# Patient Record
Sex: Female | Born: 1995 | Race: Black or African American | Hispanic: No | Marital: Single | Smoking: Never smoker
Health system: Southern US, Community
[De-identification: ages and names within clinical notes are randomized; demographics above are authoritative.]

## PROBLEM LIST (undated history)

## (undated) DIAGNOSIS — N760 Acute vaginitis: Secondary | ICD-10-CM

## (undated) DIAGNOSIS — B9689 Other specified bacterial agents as the cause of diseases classified elsewhere: Secondary | ICD-10-CM

---
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
## 2014-05-07 ENCOUNTER — Emergency Department (HOSPITAL_BASED_OUTPATIENT_CLINIC_OR_DEPARTMENT_OTHER)
Admission: EM | Admit: 2014-05-07 | Discharge: 2014-05-08 | Disposition: A | Discharge status: Home / Self Care | Payer: BC Managed Care – PPO | Attending: Emergency Medicine | Admitting: Emergency Medicine

## 2014-05-07 ENCOUNTER — Encounter (HOSPITAL_BASED_OUTPATIENT_CLINIC_OR_DEPARTMENT_OTHER): Payer: Self-pay | Admitting: Emergency Medicine

## 2014-05-07 DIAGNOSIS — Z79899 Other long term (current) drug therapy: Secondary | ICD-10-CM | POA: Diagnosis not present

## 2014-05-07 DIAGNOSIS — A499 Bacterial infection, unspecified: Secondary | ICD-10-CM | POA: Diagnosis not present

## 2014-05-07 DIAGNOSIS — B9689 Other specified bacterial agents as the cause of diseases classified elsewhere: Secondary | ICD-10-CM | POA: Insufficient documentation

## 2014-05-07 DIAGNOSIS — Z3202 Encounter for pregnancy test, result negative: Secondary | ICD-10-CM | POA: Diagnosis not present

## 2014-05-07 DIAGNOSIS — N898 Other specified noninflammatory disorders of vagina: Secondary | ICD-10-CM | POA: Diagnosis present

## 2014-05-07 DIAGNOSIS — N760 Acute vaginitis: Secondary | ICD-10-CM | POA: Diagnosis not present

## 2014-05-07 LAB — URINALYSIS, ROUTINE W REFLEX MICROSCOPIC
BILIRUBIN URINE: NEGATIVE
Glucose, UA: NEGATIVE mg/dL
Hgb urine dipstick: NEGATIVE
Ketones, ur: NEGATIVE mg/dL
NITRITE: NEGATIVE
Protein, ur: NEGATIVE mg/dL
SPECIFIC GRAVITY, URINE: 1.034 — AB (ref 1.005–1.030)
UROBILINOGEN UA: 1 mg/dL (ref 0.0–1.0)
pH: 6.5 (ref 5.0–8.0)

## 2014-05-07 LAB — URINE MICROSCOPIC-ADD ON

## 2014-05-07 LAB — PREGNANCY, URINE: Preg Test, Ur: NEGATIVE

## 2014-05-07 NOTE — ED Notes (Signed)
Pt c/o vaginal discharge and itching that began 2 days ago. Pt has had BV before and sts that this feels the same.

## 2014-05-08 LAB — WET PREP, GENITAL
Trich, Wet Prep: NONE SEEN
Yeast Wet Prep HPF POC: NONE SEEN

## 2014-05-08 MED ORDER — METRONIDAZOLE 500 MG PO TABS: 500.0000 mg | ORAL_TABLET | Freq: Two times a day (BID) | ORAL | Status: DC

## 2014-05-08 NOTE — Discharge Instructions (Signed)
Flagyl as prescribed.  We'll call you if your cultures indicate that you need further treatment.   Bacterial Vaginosis Bacterial vaginosis is a vaginal infection that occurs when the normal balance of bacteria in the vagina is disrupted. It results from an overgrowth of certain bacteria. This is the most common vaginal infection in women of childbearing age. Treatment is important to prevent complications, especially in pregnant women, as it can cause a premature delivery. CAUSES  Bacterial vaginosis is caused by an increase in harmful bacteria that are normally present in smaller amounts in the vagina. Several different kinds of bacteria can cause bacterial vaginosis. However, the reason that the condition develops is not fully understood. RISK FACTORS Certain activities or behaviors can put you at an increased risk of developing bacterial vaginosis, including:  Having a new sex partner or multiple sex partners.  Douching.  Using an intrauterine device (IUD) for contraception. Women do not get bacterial vaginosis from toilet seats, bedding, swimming pools, or contact with objects around them. SIGNS AND SYMPTOMS  Some women with bacterial vaginosis have no signs or symptoms. Common symptoms include:  Grey vaginal discharge.  A fishlike odor with discharge, especially after sexual intercourse.  Itching or burning of the vagina and vulva.  Burning or pain with urination. DIAGNOSIS  Your health care provider will take a medical history and examine the vagina for signs of bacterial vaginosis. A sample of vaginal fluid may be taken. Your health care provider will look at this sample under a microscope to check for bacteria and abnormal cells. A vaginal pH test may also be done.  TREATMENT  Bacterial vaginosis may be treated with antibiotic medicines. These may be given in the form of a pill or a vaginal cream. A second round of antibiotics may be prescribed if the condition comes back after  treatment.  HOME CARE INSTRUCTIONS   Only take over-the-counter or prescription medicines as directed by your health care provider.  If antibiotic medicine was prescribed, take it as directed. Make sure you finish it even if you start to feel better.  Do not have sex until treatment is completed.  Tell all sexual partners that you have a vaginal infection. They should see their health care provider and be treated if they have problems, such as a mild rash or itching.  Practice safe sex by using condoms and only having one sex partner. SEEK MEDICAL CARE IF:   Your symptoms are not improving after 3 days of treatment.  You have increased discharge or pain.  You have a fever. MAKE SURE YOU:   Understand these instructions.  Will watch your condition.  Will get help right away if you are not doing well or get worse. FOR MORE INFORMATION  Centers for Disease Control and Prevention, Division of STD Prevention: SolutionApps.co.zawww.cdc.gov/std American Sexual Health Association (ASHA): www.ashastd.org  Document Released: 09/11/2005 Document Revised: 07/02/2013 Document Reviewed: 04/23/2013 [facility] Patient Information 2015 [place] Cross[place]ExitCare, [place]. This information is not intended to replace advice given to you by your health care provider. Make sure you discuss any questions you have with your health care provider.

## 2014-05-08 NOTE — ED Provider Notes (Signed)
CSN: [id number]     Arrival date & time 05/07/14  2327 History   First MD Initiated Contact with Patient 05/08/14 0034     Chief Complaint  Patient presents with  . Vaginal Discharge     (Consider location/radiation/quality/duration/timing/severity/associated sxs/prior Treatment) Patient is a 18 y.o. female presenting with vaginal discharge. The history is provided by the patient.  Vaginal Discharge Quality:  White Severity:  Moderate Onset quality:  Gradual Duration:  2 days Timing:  Constant Progression:  Worsening Chronicity:  Recurrent Relieved by:  Nothing Worsened by:  Nothing tried Ineffective treatments:  None tried   History reviewed. No pertinent past medical history. History reviewed. No pertinent past surgical history. No family history on file. History  Substance Use Topics  . Smoking status: Never Smoker   . Smokeless tobacco: Not on file  . Alcohol Use: No   OB History   Grav Para Term Preterm Abortions TAB SAB Ect Mult Living                 Review of Systems  Genitourinary: Positive for vaginal discharge.  All other systems reviewed and are negative.     Allergies  Review of patient's allergies indicates no known allergies.  Home Medications   Prior to Admission medications   Medication Sig Start Date End Date Taking? Authorizing Provider  metroNIDAZOLE (FLAGYL) 500 MG tablet Take 1 tablet (500 mg total) by mouth 2 (two) times daily. 07/26/12   Elson AreasLeslie K Sofia, PA-C  Prenatal Vit-Fe Fumarate-FA (PRENATAL COMPLETE) 14-0.4 MG TABS Take 1 tablet by mouth once. 10/30/12   Elson AreasLeslie K Sofia, PA-C   BP 111/75  Pulse 73  Temp(Src) 97.6 F (36.4 C) (Oral)  Resp 18  Ht 5\' 4"  (1.626 m)  Wt 163 lb (73.936 kg)  BMI 27.97 kg/m2  SpO2 100%  LMP 04/08/2014 Physical Exam  Nursing note and vitals reviewed. Constitutional: She is oriented to person, place, and time. She appears well-developed and well-nourished. No distress.  HENT:  Head: Normocephalic and  atraumatic.  Neck: Normal range of motion. Neck supple.  Cardiovascular: Normal rate and regular rhythm.   Pulmonary/Chest: Effort normal. No respiratory distress.  Abdominal: Soft. Bowel sounds are normal. She exhibits no distension. There is no tenderness.  Genitourinary: Uterus normal.  External genitalia appears normal. There is a whitish gray discharge noted. There is no cervical motion tenderness and no adnexal tenderness or masses.  Musculoskeletal: Normal range of motion.  Neurological: She is alert and oriented to person, place, and time.  Skin: Skin is warm and dry. She is not diaphoretic.    ED Course  Procedures (including critical care time) Labs Review Labs Reviewed  URINALYSIS, ROUTINE W REFLEX MICROSCOPIC - Abnormal; Notable for the following:    Specific Gravity, Urine 1.034 (*)    Leukocytes, UA SMALL (*)    All other components within normal limits  URINE MICROSCOPIC-ADD ON - Abnormal; Notable for the following:    Squamous Epithelial / LPF FEW (*)    Bacteria, UA MANY (*)    All other components within normal limits  PREGNANCY, URINE    Imaging Review No results found.   EKG Interpretation None      MDM   Final diagnoses:  None    Patient presents with complaints similar to when she had BV. She has few clue cells on her wet prep and we'll treat with Flagyl. GC and Chlamydia cultures are pending. If these are abnormal she will be called and  informed of the results.    Geoffery Lyons, MD 05/08/14 [phone]

## 2014-05-09 LAB — GC/CHLAMYDIA PROBE AMP
CT Probe RNA: NEGATIVE
GC Probe RNA: NEGATIVE

## 2015-01-11 ENCOUNTER — Encounter (HOSPITAL_BASED_OUTPATIENT_CLINIC_OR_DEPARTMENT_OTHER): Payer: Self-pay | Admitting: *Deleted

## 2015-01-11 ENCOUNTER — Emergency Department (HOSPITAL_BASED_OUTPATIENT_CLINIC_OR_DEPARTMENT_OTHER)
Admission: EM | Admit: 2015-01-11 | Discharge: 2015-01-11 | Disposition: A | Discharge status: Home / Self Care | Payer: BLUE CROSS/BLUE SHIELD | Attending: Emergency Medicine | Admitting: Emergency Medicine

## 2015-01-11 DIAGNOSIS — Z3202 Encounter for pregnancy test, result negative: Secondary | ICD-10-CM | POA: Diagnosis not present

## 2015-01-11 DIAGNOSIS — L03317 Cellulitis of buttock: Secondary | ICD-10-CM | POA: Diagnosis not present

## 2015-01-11 DIAGNOSIS — N898 Other specified noninflammatory disorders of vagina: Secondary | ICD-10-CM | POA: Insufficient documentation

## 2015-01-11 LAB — URINALYSIS, ROUTINE W REFLEX MICROSCOPIC
Bilirubin Urine: NEGATIVE
Glucose, UA: NEGATIVE mg/dL
HGB URINE DIPSTICK: NEGATIVE
Ketones, ur: NEGATIVE mg/dL
LEUKOCYTES UA: NEGATIVE
Nitrite: NEGATIVE
PROTEIN: NEGATIVE mg/dL
Specific Gravity, Urine: 1.029 (ref 1.005–1.030)
UROBILINOGEN UA: 1 mg/dL (ref 0.0–1.0)
pH: 7 (ref 5.0–8.0)

## 2015-01-11 LAB — PREGNANCY, URINE: Preg Test, Ur: NEGATIVE

## 2015-01-11 MED ORDER — SULFAMETHOXAZOLE-TRIMETHOPRIM 800-160 MG PO TABS: 1.0000 | ORAL_TABLET | Freq: Two times a day (BID) | ORAL | Status: AC

## 2015-01-11 MED ORDER — METRONIDAZOLE 500 MG PO TABS: 500.0000 mg | ORAL_TABLET | Freq: Two times a day (BID) | ORAL | Status: DC

## 2015-01-11 NOTE — Discharge Instructions (Signed)
Take the prescribed medication as directed. As we discussed, recommend eating probiotics and drinking lots of water to help prevent recurrences of BV. Follow-up with  Return to the ED for new or worsening symptoms.

## 2015-01-11 NOTE — ED Notes (Signed)
Pt given rx x 2 for septra and flagyl

## 2015-01-11 NOTE — ED Provider Notes (Signed)
CSN: [id number]     Arrival date & time 01/11/15  1824 History   First MD Initiated Contact with Patient 01/11/15 1923     Chief Complaint  Patient presents with  . Vaginal Discharge     (Consider location/radiation/quality/duration/timing/severity/associated sxs/prior Treatment) The history is provided by the patient and medical records.    19 year old female with complaint of vaginal discharge for the past week. She has a history of BV and states similar symptoms. Discharge is white, thick, and foul-smelling. She denies any abdominal pain or pelvic pain. No dysuria, urinary frequency, or hematuria. No new sexual partners or concern for STD. She states she had recent GYN exam and had STD testing at that time. Patient also complains of developing cellulitis of her right buttock. She states she had this a few months ago and did not finish her antibiotics and thinks the infection has returned. She denies any fever or chills.  VSS.  History reviewed. No pertinent past medical history. History reviewed. No pertinent past surgical history. History reviewed. No pertinent family history. History  Substance Use Topics  . Smoking status: Never Smoker   . Smokeless tobacco: Not on file  . Alcohol Use: No   OB History    No data available     Review of Systems  Genitourinary: Positive for vaginal discharge.  Skin: Positive for color change.  All other systems reviewed and are negative.     Allergies  Review of patient's allergies indicates no known allergies.  Home Medications   Prior to Admission medications   Medication Sig Start Date End Date Taking? Authorizing Provider  Prenatal Vit-Fe Fumarate-FA (PRENATAL COMPLETE) 14-0.4 MG TABS Take 1 tablet by mouth once. 10/30/12   Elson Areas, PA-C   BP 108/62 mmHg  Pulse 78  Temp(Src) 98.4 F (36.9 C) (Oral)  Resp 16  Ht  (1.626 m)  Wt 172 lb (78.019 kg)  BMI 29.51 kg/m2  SpO2 100%   Physical Exam  Constitutional: She is  oriented to person, place, and time. She appears well-developed and well-nourished. No distress.  HENT:  Head: Normocephalic and atraumatic.  Mouth/Throat: Oropharynx is clear and moist.  Eyes: Conjunctivae and EOM are normal. Pupils are equal, round, and reactive to light.  Neck: Normal range of motion. Neck supple.  Cardiovascular: Normal rate, regular rhythm and normal heart sounds.   Pulmonary/Chest: Effort normal and breath sounds normal. No respiratory distress. She has no wheezes.  Abdominal: Soft. Bowel sounds are normal. There is no tenderness. There is no guarding.  Genitourinary:  Erythema and induration to right central buttock without appreciable fluctuance or fluid collection  Musculoskeletal: Normal range of motion. She exhibits no edema.  Neurological: She is alert and oriented to person, place, and time.  Skin: Skin is warm and dry. She is not diaphoretic.  Psychiatric: She has a normal mood and affect.  Nursing note and vitals reviewed.   ED Course  Procedures (including critical care time) Labs Review Labs Reviewed  URINALYSIS, ROUTINE W REFLEX MICROSCOPIC - Abnormal; Notable for the following:    APPearance CLOUDY (*)    All other components within normal limits  PREGNANCY, URINE    Imaging Review No results found.   EKG Interpretation None      MDM   Final diagnoses:  Vaginal discharge  Cellulitis of buttock, right   19 year old female with vaginal discharge and right buttock cellulitis. She has a history of BV and states symptoms are similar. She would prefer  not to have another pelvic exam. She reports she had recent STD testing which was negative and has no concern for STD.  Will treat for BV-- patient voiced understanding that without formal testing, unsure if this is proper diagnosis/treatment and cannot definitively exclude STD as cause of vaginal discharge.  She also appears to have a developing cellulitis of her right buttock. She has history of  the same and did not finish her full course of antibiotics. There is erythema and induration of the right central buttock without appreciable fluid collection or fluctuance.  Patient afebrile and non-toxic in appearance.  Will discharge home with Flagyl for presumed BV and Bactrim for cellulitis. She is to follow-up with her PCP.  Discussed plan with patient, he/she acknowledged understanding and agreed with plan of care.  Return precautions given for new or worsening symptoms.  Garlon HatchetLisa M Neira Bentsen, PA-C 01/11/15 [phone]  Nelva Nayobert Beaton, MD 01/16/15 [phone]

## 2015-01-11 NOTE — ED Notes (Signed)
Pt c/o vaginal discharge x 1 week  And c/o abscess to right buttocks x 1 day

## 2015-03-01 ENCOUNTER — Encounter (HOSPITAL_BASED_OUTPATIENT_CLINIC_OR_DEPARTMENT_OTHER): Payer: Self-pay | Admitting: *Deleted

## 2015-03-01 ENCOUNTER — Emergency Department (HOSPITAL_BASED_OUTPATIENT_CLINIC_OR_DEPARTMENT_OTHER)
Admission: EM | Admit: 2015-03-01 | Discharge: 2015-03-01 | Disposition: A | Discharge status: Home / Self Care | Payer: BLUE CROSS/BLUE SHIELD | Attending: Emergency Medicine | Admitting: Emergency Medicine

## 2015-03-01 DIAGNOSIS — L03114 Cellulitis of left upper limb: Secondary | ICD-10-CM | POA: Diagnosis not present

## 2015-03-01 DIAGNOSIS — Z79899 Other long term (current) drug therapy: Secondary | ICD-10-CM | POA: Diagnosis not present

## 2015-03-01 DIAGNOSIS — R2232 Localized swelling, mass and lump, left upper limb: Secondary | ICD-10-CM | POA: Diagnosis present

## 2015-03-01 MED ORDER — MELOXICAM 7.5 MG PO TABS: 15.0000 mg | ORAL_TABLET | Freq: Every day | ORAL | Status: DC

## 2015-03-01 MED ORDER — CLINDAMYCIN HCL 150 MG PO CAPS
300.0000 mg | ORAL_CAPSULE | Freq: Once | ORAL | Status: AC
  Administered 2015-03-01: 300 mg via ORAL
  Filled 2015-03-01: qty 2

## 2015-03-01 MED ORDER — CLINDAMYCIN HCL 150 MG PO CAPS: 300.0000 mg | ORAL_CAPSULE | Freq: Three times a day (TID) | ORAL | Status: DC

## 2015-03-01 NOTE — Discharge Instructions (Signed)
Please follow up with your primary care physician in 1-2 days. If you do not have one please call the [facility] number listed above. Please take your antibiotic until completion. Please read all discharge instructions and return precautions.  ° °Cellulitis °Cellulitis is an infection of the skin and the tissue beneath it. The infected area is usually red and tender. Cellulitis occurs most often in the arms and lower legs.  °CAUSES  °Cellulitis is caused by bacteria that enter the skin through cracks or cuts in the skin. The most common types of bacteria that cause cellulitis are staphylococci and streptococci. °SIGNS AND SYMPTOMS  °· Redness and warmth. °· Swelling. °· Tenderness or pain. °· Fever. °DIAGNOSIS  °Your health care provider can usually determine what is wrong based on a physical exam. Blood tests may also be done. °TREATMENT  °Treatment usually involves taking an antibiotic medicine. °HOME CARE INSTRUCTIONS  °· Take your antibiotic medicine as directed by your health care provider. Finish the antibiotic even if you start to feel better. °· Keep the infected arm or leg elevated to reduce swelling. °· Apply a warm cloth to the affected area up to 4 times per day to relieve pain. °· Take medicines only as directed by your health care provider. °· Keep all follow-up visits as directed by your health care provider. °SEEK MEDICAL CARE IF:  °· You notice red streaks coming from the infected area. °· Your red area gets larger or turns dark in color. °· Your bone or joint underneath the infected area becomes painful after the skin has healed. °· Your infection returns in the same area or another area. °· You notice a swollen bump in the infected area. °· You develop new symptoms. °· You have a fever. °SEEK IMMEDIATE MEDICAL CARE IF:  °· You feel very sleepy. °· You develop vomiting or diarrhea. °· You have a general ill feeling (malaise) with muscle aches and pains. °MAKE SURE YOU:   °· Understand these instructions. °· Will watch your condition. °· Will get help right away if you are not doing well or get worse. °Document Released: 06/21/2005 Document Revised: 01/26/2014 Document Reviewed: 11/27/2011 °ExitCare® Patient Information ©2015 ExitCare, LLC. This information is not intended to replace advice given to you by your health care provider. Make sure you discuss any questions you have with your health care provider. ° ° ° °

## 2015-03-01 NOTE — ED Provider Notes (Signed)
CSN: [id number]     Arrival date & time 03/01/15  1111 History   First MD Initiated Contact with Patient 03/01/15 1149     Chief Complaint  Patient presents with  . Insect Bite     (Consider location/radiation/quality/duration/timing/severity/associated sxs/prior Treatment) HPI Comments: Patient is a 19 year old female presenting to the emergency department for possible insect bite to left upper arm. She states the area initially started off as a bump but has red, warm and painful to touch over the last few days. Denies any fevers. Denies any drainage from the site. States she had a similar episode at which time she received antibiotics. Did not finish the course of antibiotics.    History reviewed. No pertinent past medical history. History reviewed. No pertinent past surgical history. No family history on file. History  Substance Use Topics  . Smoking status: Never Smoker   . Smokeless tobacco: Not on file  . Alcohol Use: No   OB History    No data available     Review of Systems  Skin: Positive for color change and wound.  All other systems reviewed and are negative.     Allergies  Review of patient's allergies indicates no known allergies.  Home Medications   Prior to Admission medications   Medication Sig Start Date End Date Taking? Authorizing Provider  Probiotic Product (PROBIOTIC DAILY PO) Take by mouth.   Yes Historical Provider, MD  clindamycin (CLEOCIN) 150 MG capsule Take 2 capsules (300 mg total) by mouth 3 (three) times daily. May dispense as 150mg  capsules 03/01/15   Francee PiccoloJennifer Tylena Prisk, PA-C  meloxicam (MOBIC) 7.5 MG tablet Take 2 tablets (15 mg total) by mouth daily. 03/01/15   Philomina Leon, PA-C  metroNIDAZOLE (FLAGYL) 500 MG tablet Take 1 tablet (500 mg total) by mouth 2 (two) times daily. 01/11/15   Garlon HatchetLisa M Sanders, PA-C  Prenatal Vit-Fe Fumarate-FA (PRENATAL COMPLETE) 14-0.4 MG TABS Take 1 tablet by mouth once. 10/30/12   Elson AreasLeslie K Sofia, PA-C   BP  107/58 mmHg  Pulse 90  Temp(Src) 98.6 F (37 C) (Oral)  Resp 18  Ht 5\' 4"  (1.626 m)  Wt 170 lb (77.111 kg)  BMI 29.17 kg/m2  SpO2 97%  LMP 02/26/2015 Physical Exam  Constitutional: She is oriented to person, place, and time. She appears well-developed and well-nourished. No distress.  HENT:  Head: Normocephalic and atraumatic.  Right Ear: External ear normal.  Left Ear: External ear normal.  Nose: Nose normal.  Mouth/Throat: Oropharynx is clear and moist.  Eyes: Conjunctivae are normal.  Neck: Normal range of motion. Neck supple.  No nuchal rigidity.   Cardiovascular: Normal rate, regular rhythm, normal heart sounds and intact distal pulses.   Pulmonary/Chest: Effort normal and breath sounds normal. No respiratory distress.  Abdominal: Soft.  Musculoskeletal: Normal range of motion.  Neurological: She is alert and oriented to person, place, and time.  Skin: Skin is warm and dry. She is not diaphoretic.     Psychiatric: She has a normal mood and affect.  Nursing note and vitals reviewed.   ED Course  Procedures (including critical care time) Medications  clindamycin (CLEOCIN) capsule 300 mg (300 mg Oral Given 03/01/15 1232)    Labs Review Labs Reviewed - No data to display  Imaging Review No results found.   EKG Interpretation None      MDM   Final diagnoses:  Left arm cellulitis    Filed Vitals:   03/01/15 1132  BP: 107/58  Pulse: 90  Temp: 98.6 F (37 C)  Resp: 18   Afebrile, NAD, non-toxic appearing, AAOx4.   Suspect uncomplicated cellulitis based on limited area of involvement, minimal pain, no systemic signs of illness (eg, fever, chills, dehydration, altered mental status, tachypnea, tachycardia, hypotension), no risk factors for serious illness (eg, extremes of age, general debility, immunocompromised status).   PE reveals redness, swelling, mildly tender, warm to touch. Skin intact, No bleeding. No bullae. Non purulent. Non circumferential.   Borders are not elevated or sharply demarcated.  Pt was instructed to return to the ED if area surpasses the boarder or pain intensifies.   Will prescribed clindamycin to cover for MRSA, direct pt to apply warm compresses and to return to ED for I&D if pain should increase or abscess should develop. Patient is stable at time of discharge   Francee Piccolo, PA-C 03/01/15 1246  Toy Cookey, MD 03/01/15 1544

## 2015-03-01 NOTE — ED Notes (Signed)
States she has a spider bite to her left upper arm and needs an antibiotic. Hx of cellulitis.

## 2015-05-18 ENCOUNTER — Emergency Department (HOSPITAL_BASED_OUTPATIENT_CLINIC_OR_DEPARTMENT_OTHER)
Admission: EM | Admit: 2015-05-18 | Discharge: 2015-05-18 | Disposition: A | Discharge status: Home / Self Care | Payer: BLUE CROSS/BLUE SHIELD | Attending: Emergency Medicine | Admitting: Emergency Medicine

## 2015-05-18 ENCOUNTER — Encounter (HOSPITAL_BASED_OUTPATIENT_CLINIC_OR_DEPARTMENT_OTHER): Payer: Self-pay

## 2015-05-18 DIAGNOSIS — Z792 Long term (current) use of antibiotics: Secondary | ICD-10-CM | POA: Diagnosis not present

## 2015-05-18 DIAGNOSIS — Z3202 Encounter for pregnancy test, result negative: Secondary | ICD-10-CM | POA: Diagnosis not present

## 2015-05-18 DIAGNOSIS — Z79899 Other long term (current) drug therapy: Secondary | ICD-10-CM | POA: Insufficient documentation

## 2015-05-18 DIAGNOSIS — N760 Acute vaginitis: Secondary | ICD-10-CM | POA: Insufficient documentation

## 2015-05-18 DIAGNOSIS — B9689 Other specified bacterial agents as the cause of diseases classified elsewhere: Secondary | ICD-10-CM

## 2015-05-18 DIAGNOSIS — N898 Other specified noninflammatory disorders of vagina: Secondary | ICD-10-CM | POA: Diagnosis present

## 2015-05-18 LAB — URINALYSIS, ROUTINE W REFLEX MICROSCOPIC
Bilirubin Urine: NEGATIVE
GLUCOSE, UA: NEGATIVE mg/dL
HGB URINE DIPSTICK: NEGATIVE
Ketones, ur: NEGATIVE mg/dL
Leukocytes, UA: NEGATIVE
Nitrite: NEGATIVE
PROTEIN: NEGATIVE mg/dL
SPECIFIC GRAVITY, URINE: 1.035 — AB (ref 1.005–1.030)
Urobilinogen, UA: 1 mg/dL (ref 0.0–1.0)
pH: 5.5 (ref 5.0–8.0)

## 2015-05-18 LAB — WET PREP, GENITAL
TRICH WET PREP: NONE SEEN
YEAST WET PREP: NONE SEEN

## 2015-05-18 LAB — PREGNANCY, URINE: Preg Test, Ur: NEGATIVE

## 2015-05-18 MED ORDER — METRONIDAZOLE 500 MG PO TABS
500.0000 mg | ORAL_TABLET | Freq: Once | ORAL | Status: AC
  Administered 2015-05-18: 500 mg via ORAL
  Filled 2015-05-18: qty 1

## 2015-05-18 MED ORDER — METRONIDAZOLE 500 MG PO TABS: 500.0000 mg | ORAL_TABLET | Freq: Two times a day (BID) | ORAL | Status: DC

## 2015-05-18 NOTE — ED Provider Notes (Signed)
CSN: [id number]     Arrival date & time 05/18/15  2149 History  This chart was scribed for Gilda Crease, MD by Octavia Heir, ED Scribe. This patient was seen in room MH10/MH10 and the patient's care was started at 10:28 PM.    Chief Complaint  Patient presents with  . Vaginal Discharge     The history is provided by the patient. No language interpreter was used.   HPI Comments: Jennifer Paul is a 19 y.o. female who presents to the Emergency Department complaining of sudden onset, recurrent, gradual worsening vaginal discharge onset 3 days ago. Pt notes having some vaginal discharge with foul odor. She states she takes a probiotic to balance her pH but is unsure if that continues to cause her infections. Pt has a hx of having bacterial vaginosis and states she is having similar symptoms. Pt denies dysuria and difficulty urinating.  History reviewed. No pertinent past medical history. History reviewed. No pertinent past surgical history. History reviewed. No pertinent family history. Social History  Substance Use Topics  . Smoking status: Never Smoker   . Smokeless tobacco: None  . Alcohol Use: No   OB History    No data available     Review of Systems  Genitourinary: Positive for vaginal discharge. Negative for dysuria and difficulty urinating.  All other systems reviewed and are negative.     Allergies  Review of patient's allergies indicates no known allergies.  Home Medications   Prior to Admission medications   Medication Sig Start Date End Date Taking? Authorizing Provider  Probiotic Product (PROBIOTIC DAILY PO) Take by mouth.   Yes Historical Provider, MD  clindamycin (CLEOCIN) 150 MG capsule Take 2 capsules (300 mg total) by mouth 3 (three) times daily. May dispense as  capsules 03/01/15   Francee Piccolo, PA-C  meloxicam (MOBIC) 7.5 MG tablet Take 2 tablets (15 mg total) by mouth daily. 03/01/15   Jennifer Piepenbrink, PA-C  metroNIDAZOLE (FLAGYL)  500 MG tablet Take 1 tablet (500 mg total) by mouth 2 (two) times daily. 01/11/15   Garlon Hatchet, PA-C  Prenatal Vit-Fe Fumarate-FA (PRENATAL COMPLETE) 14-0.4 MG TABS Take 1 tablet by mouth once. 10/30/12   Elson Areas, PA-C   Triage vitals: BP 118/64 mmHg  Pulse 91  Temp(Src) 97.7 F (36.5 C) (Oral)  Resp 16  Ht  (1.626 m)  Wt 178 lb (80.74 kg)  BMI 30.54 kg/m2  SpO2 99% Physical Exam  Constitutional: She is oriented to person, place, and time. She appears well-developed and well-nourished. No distress.  HENT:  Head: Normocephalic and atraumatic.  Right Ear: Hearing normal.  Left Ear: Hearing normal.  Nose: Nose normal.  Mouth/Throat: Oropharynx is clear and moist and mucous membranes are normal.  Eyes: Conjunctivae and EOM are normal. Pupils are equal, round, and reactive to light.  Neck: Normal range of motion. Neck supple.  Cardiovascular: Regular rhythm, S1 normal and S2 normal.  Exam reveals no gallop and no friction rub.   No murmur heard. Pulmonary/Chest: Effort normal and breath sounds normal. No respiratory distress. She exhibits no tenderness.  Abdominal: Soft. Normal appearance and bowel sounds are normal. There is no hepatosplenomegaly. There is no tenderness. There is no rebound, no guarding, no tenderness at McBurney's point and negative Murphy's sign. No hernia.  Genitourinary: Vagina normal and uterus normal. There is no rash on the right labia. There is no rash on the left labia. Cervix exhibits no motion tenderness and no discharge. Right  adnexum displays no mass and no tenderness. Left adnexum displays no mass and no tenderness.  Musculoskeletal: Normal range of motion.  Neurological: She is alert and oriented to person, place, and time. She has normal strength. No cranial nerve deficit or sensory deficit. Coordination normal. GCS eye subscore is 4. GCS verbal subscore is 5. GCS motor subscore is 6.  Skin: Skin is warm, dry and intact. No rash noted. No  cyanosis.  Psychiatric: She has a normal mood and affect. Her speech is normal and behavior is normal. Thought content normal.  Nursing note and vitals reviewed.   ED Course  Procedures  DIAGNOSTIC STUDIES: Oxygen Saturation is 99% on RA, normal by my interpretation.  COORDINATION OF CARE:  10:31 PM Discussed treatment plan which includes pelvic exam with pt at bedside and pt agreed to plan.  Labs Review Labs Reviewed - No data to display  Imaging Review No results found. I have personally reviewed and evaluated these images and lab results as part of my medical decision-making.   EKG Interpretation None      MDM   Final diagnoses:  None   bacterial vaginosis  Patient presents to the ER for evaluation of 2 day history of vaginal discharge with foul odor. She reports that this is similar to this bacterial vaginosis episodes she has had in the past. Pelvic examination revealed small amount of discharge, otherwise normal. No cervical motion tenderness, no adnexal tenderness. Patient treated with Flagyl.  I personally performed the services described in this documentation, which was scribed in my presence. The recorded information has been reviewed and is accurate.     Gilda Crease, MD 05/18/15 [phone]

## 2015-05-18 NOTE — ED Notes (Signed)
Pt reports vaginal discharge x2 days with foul odor. States it seems like past bacterial vaginosis infections.

## 2015-05-18 NOTE — Discharge Instructions (Signed)
Bacterial Vaginosis Bacterial vaginosis is a vaginal infection that occurs when the normal balance of bacteria in the vagina is disrupted. It results from an overgrowth of certain bacteria. This is the most common vaginal infection in women of childbearing age. Treatment is important to prevent complications, especially in pregnant women, as it can cause a premature delivery. CAUSES  Bacterial vaginosis is caused by an increase in harmful bacteria that are normally present in smaller amounts in the vagina. Several different kinds of bacteria can cause bacterial vaginosis. However, the reason that the condition develops is not fully understood. RISK FACTORS Certain activities or behaviors can put you at an increased risk of developing bacterial vaginosis, including:  Having a new sex partner or multiple sex partners.  Douching.  Using an intrauterine device (IUD) for contraception. Women do not get bacterial vaginosis from toilet seats, bedding, swimming pools, or contact with objects around them. SIGNS AND SYMPTOMS  Some women with bacterial vaginosis have no signs or symptoms. Common symptoms include:  Grey vaginal discharge.  A fishlike odor with discharge, especially after sexual intercourse.  Itching or burning of the vagina and vulva.  Burning or pain with urination. DIAGNOSIS  Your health care provider will take a medical history and examine the vagina for signs of bacterial vaginosis. A sample of vaginal fluid may be taken. Your health care provider will look at this sample under a microscope to check for bacteria and abnormal cells. A vaginal pH test may also be done.  TREATMENT  Bacterial vaginosis may be treated with antibiotic medicines. These may be given in the form of a pill or a vaginal cream. A second round of antibiotics may be prescribed if the condition comes back after treatment.  HOME CARE INSTRUCTIONS   Only take over-the-counter or prescription medicines as  directed by your health care provider.  If antibiotic medicine was prescribed, take it as directed. Make sure you finish it even if you start to feel better.  Do not have sex until treatment is completed.  Tell all sexual partners that you have a vaginal infection. They should see their health care provider and be treated if they have problems, such as a mild rash or itching.  Practice safe sex by using condoms and only having one sex partner. SEEK MEDICAL CARE IF:   Your symptoms are not improving after 3 days of treatment.  You have increased discharge or pain.  You have a fever. MAKE SURE YOU:   Understand these instructions.  Will watch your condition.  Will get help right away if you are not doing well or get worse. FOR MORE INFORMATION  Centers for Disease Control and Prevention, Division of STD Prevention: www.cdc.gov/std American Sexual Health Association (ASHA): www.ashastd.org  Document Released: 09/11/2005 Document Revised: 07/02/2013 Document Reviewed: 04/23/2013 Exit[employer] Patient Information 2015 [employer]. This information is not intended to replace advice given to you by your health care provider. Make sure you discuss any questions you have with your health care provider.  

## 2015-05-20 LAB — GC/CHLAMYDIA PROBE AMP (~~LOC~~) NOT AT ARMC
CHLAMYDIA, DNA PROBE: NEGATIVE
NEISSERIA GONORRHEA: NEGATIVE

## 2015-06-17 ENCOUNTER — Emergency Department (HOSPITAL_BASED_OUTPATIENT_CLINIC_OR_DEPARTMENT_OTHER)
Admission: EM | Admit: 2015-06-17 | Discharge: 2015-06-18 | Disposition: A | Discharge status: Home / Self Care | Payer: BLUE CROSS/BLUE SHIELD | Attending: Emergency Medicine | Admitting: Emergency Medicine

## 2015-06-17 ENCOUNTER — Encounter (HOSPITAL_BASED_OUTPATIENT_CLINIC_OR_DEPARTMENT_OTHER): Payer: Self-pay | Admitting: *Deleted

## 2015-06-17 DIAGNOSIS — Z791 Long term (current) use of non-steroidal anti-inflammatories (NSAID): Secondary | ICD-10-CM | POA: Insufficient documentation

## 2015-06-17 DIAGNOSIS — N898 Other specified noninflammatory disorders of vagina: Secondary | ICD-10-CM

## 2015-06-17 DIAGNOSIS — Z792 Long term (current) use of antibiotics: Secondary | ICD-10-CM | POA: Insufficient documentation

## 2015-06-17 DIAGNOSIS — Z3202 Encounter for pregnancy test, result negative: Secondary | ICD-10-CM | POA: Diagnosis not present

## 2015-06-17 LAB — URINALYSIS, ROUTINE W REFLEX MICROSCOPIC
Bilirubin Urine: NEGATIVE
Glucose, UA: NEGATIVE mg/dL
Hgb urine dipstick: NEGATIVE
KETONES UR: NEGATIVE mg/dL
LEUKOCYTES UA: NEGATIVE
NITRITE: NEGATIVE
PH: 5.5 (ref 5.0–8.0)
PROTEIN: NEGATIVE mg/dL
Specific Gravity, Urine: 1.027 (ref 1.005–1.030)
Urobilinogen, UA: 0.2 mg/dL (ref 0.0–1.0)

## 2015-06-17 LAB — PREGNANCY, URINE: Preg Test, Ur: NEGATIVE

## 2015-06-17 NOTE — ED Provider Notes (Signed)
CSN: [id number]     Arrival date & time 06/17/15  2312 History  This chart was scribed for Shon Baton, MD by Octavia Heir, ED Scribe. This patient was seen in room MH02/MH02 and the patient's care was started at 11:34 PM.    Chief Complaint  Patient presents with  . Vaginal Discharge      The history is provided by the patient. No language interpreter was used.   HPI Comments: Jennifer Paul is a 19 y.o. female who presents to the Emergency Department complaining of intermittent, recurrent, gradual worsening vaginal discharge onset 3 days ago. She has associated foul odor. Pt has a hx of bacterial vaginosis. Pt has one sexual partner and does not use protection. She denies dysuria, burning while urinating, fever, abdominal pain and hematuria.   History reviewed. No pertinent past medical history. History reviewed. No pertinent past surgical history. History reviewed. No pertinent family history. Social History  Substance Use Topics  . Smoking status: Never Smoker   . Smokeless tobacco: None  . Alcohol Use: No   OB History    No data available     Review of Systems  Constitutional: Negative for fever.  Gastrointestinal: Negative for abdominal pain.  Genitourinary: Positive for vaginal discharge. Negative for dysuria, hematuria and difficulty urinating.  All other systems reviewed and are negative.     Allergies  Review of patient's allergies indicates no known allergies.  Home Medications   Prior to Admission medications   Medication Sig Start Date End Date Taking? Authorizing Provider  clindamycin (CLEOCIN) 150 MG capsule Take 2 capsules (300 mg total) by mouth 3 (three) times daily. May dispense as  capsules 03/01/15   Francee Piccolo, PA-C  meloxicam (MOBIC) 7.5 MG tablet Take 2 tablets (15 mg total) by mouth daily. 03/01/15   Jennifer Piepenbrink, PA-C  metroNIDAZOLE (FLAGYL) 500 MG tablet Take 1 tablet (500 mg total) by mouth 2 (two) times daily. One  po bid x 7 days 05/18/15   Gilda Crease, MD  Prenatal Vit-Fe Fumarate-FA (PRENATAL COMPLETE) 14-0.4 MG TABS Take 1 tablet by mouth once. 10/30/12   Elson Areas, PA-C  Probiotic Product (PROBIOTIC DAILY PO) Take by mouth.    Historical Provider, MD   Triage vitals: BP 107/63 mmHg  Pulse 84  Temp(Src) 97.8 F (36.6 C) (Oral)  Resp 18  Ht  (1.626 m)  Wt 170 lb (77.111 kg)  BMI 29.17 kg/m2  SpO2 100% Physical Exam  Constitutional: She is oriented to person, place, and time. She appears well-developed and well-nourished. No distress.  HENT:  Head: Normocephalic and atraumatic.  Cardiovascular: Normal rate, regular rhythm and normal heart sounds.   Pulmonary/Chest: Effort normal. No respiratory distress.  Abdominal: Soft. There is no tenderness.  Genitourinary:  Scant vaginal discharge, no lesions noted, mild friability noted of the cervix, no cervical motion tenderness  Neurological: She is alert and oriented to person, place, and time.  Skin: Skin is warm and dry.  Psychiatric: She has a normal mood and affect.  Nursing note and vitals reviewed.   ED Course  Procedures  DIAGNOSTIC STUDIES: Oxygen Saturation is 100% on RA, normal by my interpretation.  COORDINATION OF CARE:  11:37 PM Discussed treatment plan which includes pelvic exam with pt at bedside and pt agreed to plan.  Labs Review Labs Reviewed  WET PREP, GENITAL - Abnormal; Notable for the following:    WBC, Wet Prep HPF POC FEW (*)    All other components  within normal limits  URINALYSIS, ROUTINE W REFLEX MICROSCOPIC (NOT AT [facility])  PREGNANCY, URINE  GC/CHLAMYDIA PROBE AMP ([place]) NOT AT [facility]    Imaging Review No results found. I have personally reviewed and evaluated these images and lab results as part of my medical decision-making.   EKG Interpretation None      MDM   Final diagnoses:  Vaginal discharge    Patient presents with concerns for persistent bacterial vaginosis.  Nontoxic on exam. No other complaints. Vaginal exam is unremarkable. Wet prep only shows white blood cells. No clue cells. GC chlamydia was sent. Patient is adamant that she is not concerned about other STDs.  No indication for treatment at this time. Discussed with patient continue good hygiene practices and wearing cotton underwear.  After history, exam, and medical workup I feel the patient has been appropriately medically screened and is safe for discharge home. Pertinent diagnoses were discussed with the patient. Patient was given return precautions.  I personally performed the services described in this documentation, which was scribed in my presence. The recorded information has been reviewed and is accurate.    Shon Baton, MD 06/18/15 [phone]

## 2015-06-17 NOTE — ED Notes (Addendum)
"  Here for flagyl for chronic BV".  Sx returned 3d ago. Reports vaginal d/c and foul odor. Denies pain or other sx. Alert, NAD, calm, interactive, steady gait.

## 2015-06-18 LAB — WET PREP, GENITAL
Clue Cells Wet Prep HPF POC: NONE SEEN
Trich, Wet Prep: NONE SEEN
YEAST WET PREP: NONE SEEN

## 2015-06-18 NOTE — Discharge Instructions (Signed)
You were seen today for vaginal discharge. There is no indication at this time that this is bacterial vaginosis. Your initial workup is reassuring.  GC/chlamydia testing is pending.

## 2015-06-21 LAB — GC/CHLAMYDIA PROBE AMP (~~LOC~~) NOT AT ARMC
CHLAMYDIA, DNA PROBE: NEGATIVE
Neisseria Gonorrhea: NEGATIVE

## 2015-07-31 ENCOUNTER — Emergency Department (HOSPITAL_BASED_OUTPATIENT_CLINIC_OR_DEPARTMENT_OTHER)
Admission: EM | Admit: 2015-07-31 | Discharge: 2015-07-31 | Disposition: A | Discharge status: Home / Self Care | Payer: BLUE CROSS/BLUE SHIELD | Attending: Physician Assistant | Admitting: Physician Assistant

## 2015-07-31 ENCOUNTER — Encounter (HOSPITAL_BASED_OUTPATIENT_CLINIC_OR_DEPARTMENT_OTHER): Payer: Self-pay | Admitting: Emergency Medicine

## 2015-07-31 ENCOUNTER — Emergency Department (HOSPITAL_BASED_OUTPATIENT_CLINIC_OR_DEPARTMENT_OTHER): Payer: BLUE CROSS/BLUE SHIELD

## 2015-07-31 DIAGNOSIS — J029 Acute pharyngitis, unspecified: Secondary | ICD-10-CM

## 2015-07-31 DIAGNOSIS — K120 Recurrent oral aphthae: Secondary | ICD-10-CM | POA: Insufficient documentation

## 2015-07-31 DIAGNOSIS — R110 Nausea: Secondary | ICD-10-CM | POA: Insufficient documentation

## 2015-07-31 DIAGNOSIS — Z3202 Encounter for pregnancy test, result negative: Secondary | ICD-10-CM | POA: Insufficient documentation

## 2015-07-31 LAB — URINALYSIS, ROUTINE W REFLEX MICROSCOPIC
Glucose, UA: NEGATIVE mg/dL
HGB URINE DIPSTICK: NEGATIVE
Ketones, ur: >80 mg/dL — AB
Leukocytes, UA: NEGATIVE
Nitrite: NEGATIVE
PROTEIN: NEGATIVE mg/dL
Specific Gravity, Urine: 1.036 — ABNORMAL HIGH (ref 1.005–1.030)
UROBILINOGEN UA: 1 mg/dL (ref 0.0–1.0)
pH: 6 (ref 5.0–8.0)

## 2015-07-31 LAB — DIFFERENTIAL
BAND NEUTROPHILS: 0 %
BASOS ABS: 0 10*3/uL (ref 0.0–0.1)
BLASTS: 0 %
Basophils Relative: 0 %
EOS ABS: 0.1 10*3/uL (ref 0.0–0.7)
Eosinophils Relative: 1 %
LYMPHS PCT: 36 %
Lymphs Abs: 2.7 10*3/uL (ref 0.7–4.0)
MONOS PCT: 5 %
Metamyelocytes Relative: 0 %
Monocytes Absolute: 0.4 10*3/uL (ref 0.1–1.0)
Myelocytes: 0 %
NEUTROS PCT: 58 %
Neutro Abs: 4.2 10*3/uL (ref 1.7–7.7)
OTHER: 0 %
Promyelocytes Absolute: 0 %
nRBC: 0 /100 WBC

## 2015-07-31 LAB — CBC
HCT: 37.9 % (ref 36.0–46.0)
HEMOGLOBIN: 12.5 g/dL (ref 12.0–15.0)
MCH: 27.7 pg (ref 26.0–34.0)
MCHC: 33 g/dL (ref 30.0–36.0)
MCV: 83.8 fL (ref 78.0–100.0)
Platelets: 312 10*3/uL (ref 150–400)
RBC: 4.52 MIL/uL (ref 3.87–5.11)
RDW: 12.3 % (ref 11.5–15.5)
WBC: 7.4 10*3/uL (ref 4.0–10.5)

## 2015-07-31 LAB — BASIC METABOLIC PANEL
ANION GAP: 9 (ref 5–15)
BUN: 13 mg/dL (ref 6–20)
CALCIUM: 8.9 mg/dL (ref 8.9–10.3)
CO2: 25 mmol/L (ref 22–32)
Chloride: 104 mmol/L (ref 101–111)
Creatinine, Ser: 0.68 mg/dL (ref 0.44–1.00)
Glucose, Bld: 81 mg/dL (ref 65–99)
POTASSIUM: 3.8 mmol/L (ref 3.5–5.1)
SODIUM: 138 mmol/L (ref 135–145)

## 2015-07-31 LAB — PREGNANCY, URINE: PREG TEST UR: NEGATIVE

## 2015-07-31 MED ORDER — ONDANSETRON HCL 4 MG/2ML IJ SOLN
4.0000 mg | Freq: Once | INTRAMUSCULAR | Status: AC
  Administered 2015-07-31: 4 mg via INTRAVENOUS
  Filled 2015-07-31: qty 2

## 2015-07-31 MED ORDER — SODIUM CHLORIDE 0.9 % IV BOLUS (SEPSIS)
1000.0000 mL | Freq: Once | INTRAVENOUS | Status: AC
  Administered 2015-07-31: 1000 mL via INTRAVENOUS

## 2015-07-31 MED ORDER — MAGIC MOUTHWASH W/LIDOCAINE
15.0000 mL | Freq: Three times a day (TID) | ORAL | Status: DC | PRN
Start: 2015-07-31 — End: 2016-02-12

## 2015-07-31 NOTE — ED Provider Notes (Signed)
CSN: [id number]     Arrival date & time 07/31/15  1659 History   First MD Initiated Contact with Patient 07/31/15 1913     Chief Complaint  Patient presents with  . Dehydration  . Sore Throat     (Consider location/radiation/quality/duration/timing/severity/associated sxs/prior Treatment) The history is provided by the patient and medical records.     19 year old female with no significant past medical history presenting to the ED for sore throat. Patient states that started 30 visit for the same. She states she was seen Tuesday at urgent care and diagnosed with viral pharyngitis. She was encouraged to take Tylenol or Motrin which she did without relief. She was seen again on Thursday started on amoxicillin for coverage of strep throat. She states she has taken 2 full days of this medication but has not noticed any improvement. She states she feels generally weak but she attributes this to poor oral intake. She states is very painful to swallow. She is able to tolerate solids and liquids equally, but states she does not like eating because it hurts. She also reports some nausea which she also attributes to poor oral intake. She denies any abdominal pain, vomiting, diarrhea, fever, or chills.  History reviewed. No pertinent past medical history. History reviewed. No pertinent past surgical history. History reviewed. No pertinent family history. Social History  Substance Use Topics  . Smoking status: Never Smoker   . Smokeless tobacco: None  . Alcohol Use: No   OB History    No data available     Review of Systems  HENT: Positive for sore throat.   Gastrointestinal: Positive for nausea.  All other systems reviewed and are negative.     Allergies  Review of patient's allergies indicates no known allergies.  Home Medications   Prior to Admission medications   Medication Sig Start Date End Date Taking? Authorizing Provider  amoxicillin (AMOXIL) 250 MG capsule Take 250 mg by  mouth 3 (three) times daily.   Yes Historical Provider, MD  clindamycin (CLEOCIN) 150 MG capsule Take 2 capsules (300 mg total) by mouth 3 (three) times daily. May dispense as 150mg  capsules 03/01/15   Francee PiccoloJennifer Piepenbrink, PA-C  meloxicam (MOBIC) 7.5 MG tablet Take 2 tablets (15 mg total) by mouth daily. 03/01/15   Jennifer Piepenbrink, PA-C  metroNIDAZOLE (FLAGYL) 500 MG tablet Take 1 tablet (500 mg total) by mouth 2 (two) times daily. One po bid x 7 days 05/18/15   Gilda Creasehristopher J Pollina, MD  Prenatal Vit-Fe Fumarate-FA (PRENATAL COMPLETE) 14-0.4 MG TABS Take 1 tablet by mouth once. 10/30/12   Elson AreasLeslie K Sofia, PA-C  Probiotic Product (PROBIOTIC DAILY PO) Take by mouth.    Historical Provider, MD   BP 112/70 mmHg  Pulse 88  Temp(Src) 99.1 F (37.3 C) (Oral)  Resp 18  Ht 5\' 4"  (1.626 m)  Wt 170 lb (77.111 kg)  BMI 29.17 kg/m2  SpO2 100%   Physical Exam  Constitutional: She is oriented to person, place, and time. She appears well-developed and well-nourished. No distress.  HENT:  Head: Normocephalic and atraumatic.  Mouth/Throat: Oropharynx is clear and moist.  Tonsils erythematous but not edematous, small exudate noted on left tonsil; uvula midline without evidence of peritonsillar abscess; handling secretions appropriately; no difficulty swallowing or speaking; normal phonation, no stridor Mucous membranes remain moist 2 small aphthous ulcers noted to right side of mouth, no signs of abscess formation  Eyes: Conjunctivae and EOM are normal. Pupils are equal, round, and reactive to light.  Neck: Normal range of motion. Neck supple.  Cardiovascular: Normal rate, regular rhythm and normal heart sounds.   Pulmonary/Chest: Effort normal and breath sounds normal. No respiratory distress. She has no wheezes.  Abdominal: Soft. Bowel sounds are normal. There is no tenderness. There is no guarding.  Musculoskeletal: Normal range of motion.  Lymphadenopathy:    She has no cervical adenopathy.   Neurological: She is alert and oriented to person, place, and time.  Skin: Skin is warm and dry. She is not diaphoretic.  Psychiatric: She has a normal mood and affect.  Nursing note and vitals reviewed.   ED Course  Procedures (including critical care time) Labs Review Labs Reviewed  URINALYSIS, ROUTINE W REFLEX MICROSCOPIC (NOT AT [facility]) - Abnormal; Notable for the following:    Color, Urine AMBER (*)    APPearance CLOUDY (*)    Specific Gravity, Urine 1.036 (*)    Bilirubin Urine SMALL (*)    Ketones, ur >80 (*)    All other components within normal limits  BASIC METABOLIC PANEL  CBC  DIFFERENTIAL  PREGNANCY, URINE    Imaging Review Dg Chest 2 View  07/31/2015  CLINICAL DATA:  Shortness of breath EXAM: CHEST  2 VIEW COMPARISON:  None. FINDINGS: Normal heart size. Normal mediastinal contour. No pneumothorax. No pleural effusion. Clear lungs, with no focal lung consolidation and no pulmonary edema. IMPRESSION: No active cardiopulmonary disease. Electronically Signed   By: Delbert Phenix M.D.   On: 07/31/2015 17:51   I have personally reviewed and evaluated these images and lab results as part of my medical decision-making.   EKG Interpretation None      MDM   Final diagnoses:  Sore throat   19 year old female here with sore throat. She reports she feels "dehydrated" and she has had limited oral intake due to sore throat and ulcers in her mouth. Patient is afebrile, nontoxic. Her tonsils are not edematous, she does have small amount of exudate on left tonsil. No evidence of peritonsillar abscess, uvula remains midline. She is handling her secretions well, normal phonation without stridor. She does have a few scattered aphthous ulcers on the right side of her mouth without associated abscess formation. Labwork was obtained and is reassuring. Chest x-ray is clear. Patient was treated with IV fluids and Zofran with improvement of her symptoms. She states she is feeling better. She  has tolerated water and hot tea without difficulty here in emergency department. Will discharge home with supportive care including Magic mouthwash. She is continue her amoxicillin as directed. Follow-up with PCP.  Discussed plan with patient, he/she acknowledged understanding and agreed with plan of care.  Return precautions given for new or worsening symptoms.  Garlon Hatchet, PA-C 07/31/15 2323  Courteney Randall An, MD 08/01/15 1453

## 2015-07-31 NOTE — ED Notes (Signed)
PA at bedside.

## 2015-07-31 NOTE — ED Notes (Addendum)
Patient states that this is the third visit for the same symtoms. Reports that she is having generalized weakness and sore throat with ulcers. Patient was started on amoxicillin and lidocaine so that she could eat on #2 visit. The patient reports that she now has worsening weakness and she still can not eat. The patient reports "I know i can feel dehydration coming because I am getting nausea".  Patient states that when she wants to eat it hurts her "mouth hurt so bad it makes me not want to eat"

## 2015-07-31 NOTE — ED Notes (Signed)
Pt given water and hot tea for fluid challenge

## 2015-07-31 NOTE — Discharge Instructions (Signed)
Take the prescribed medication as directed.  Continue taking your amoxicillin as directed. May continue Tylenol and/or Motrin as needed for pain Follow-up with your primary care physician. Return to the ED for new or worsening symptoms.

## 2015-08-30 ENCOUNTER — Encounter (HOSPITAL_BASED_OUTPATIENT_CLINIC_OR_DEPARTMENT_OTHER): Payer: Self-pay | Admitting: *Deleted

## 2015-08-30 ENCOUNTER — Emergency Department (HOSPITAL_BASED_OUTPATIENT_CLINIC_OR_DEPARTMENT_OTHER)
Admission: EM | Admit: 2015-08-30 | Discharge: 2015-08-30 | Disposition: A | Discharge status: Home / Self Care | Payer: BLUE CROSS/BLUE SHIELD | Attending: Emergency Medicine | Admitting: Emergency Medicine

## 2015-08-30 DIAGNOSIS — Z791 Long term (current) use of non-steroidal anti-inflammatories (NSAID): Secondary | ICD-10-CM | POA: Insufficient documentation

## 2015-08-30 DIAGNOSIS — Z3202 Encounter for pregnancy test, result negative: Secondary | ICD-10-CM | POA: Insufficient documentation

## 2015-08-30 DIAGNOSIS — Z792 Long term (current) use of antibiotics: Secondary | ICD-10-CM | POA: Insufficient documentation

## 2015-08-30 DIAGNOSIS — N760 Acute vaginitis: Secondary | ICD-10-CM | POA: Insufficient documentation

## 2015-08-30 DIAGNOSIS — B9689 Other specified bacterial agents as the cause of diseases classified elsewhere: Secondary | ICD-10-CM

## 2015-08-30 LAB — URINALYSIS, ROUTINE W REFLEX MICROSCOPIC
Bilirubin Urine: NEGATIVE
Glucose, UA: NEGATIVE mg/dL
Hgb urine dipstick: NEGATIVE
Ketones, ur: NEGATIVE mg/dL
NITRITE: NEGATIVE
Protein, ur: NEGATIVE mg/dL
SPECIFIC GRAVITY, URINE: 1.026 (ref 1.005–1.030)
pH: 7 (ref 5.0–8.0)

## 2015-08-30 LAB — WET PREP, GENITAL
SPERM: NONE SEEN
TRICH WET PREP: NONE SEEN
YEAST WET PREP: NONE SEEN

## 2015-08-30 LAB — URINE MICROSCOPIC-ADD ON: RBC / HPF: NONE SEEN RBC/hpf (ref 0–5)

## 2015-08-30 LAB — PREGNANCY, URINE: PREG TEST UR: NEGATIVE

## 2015-08-30 MED ORDER — ONDANSETRON HCL 8 MG PO TABS
4.0000 mg | ORAL_TABLET | Freq: Once | ORAL | Status: AC
  Administered 2015-08-30: 4 mg via ORAL
  Filled 2015-08-30: qty 1

## 2015-08-30 MED ORDER — METRONIDAZOLE 500 MG PO TABS
500.0000 mg | ORAL_TABLET | Freq: Once | ORAL | Status: AC
  Administered 2015-08-30: 500 mg via ORAL
  Filled 2015-08-30: qty 1

## 2015-08-30 MED ORDER — METRONIDAZOLE 500 MG PO TABS: 500.0000 mg | ORAL_TABLET | Freq: Two times a day (BID) | ORAL | Status: DC

## 2015-08-30 MED ORDER — AZITHROMYCIN 250 MG PO TABS
1000.0000 mg | ORAL_TABLET | Freq: Once | ORAL | Status: AC
  Administered 2015-08-30: 1000 mg via ORAL
  Filled 2015-08-30: qty 4

## 2015-08-30 MED ORDER — CEFTRIAXONE SODIUM 250 MG IJ SOLR
250.0000 mg | Freq: Once | INTRAMUSCULAR | Status: AC
  Administered 2015-08-30: 250 mg via INTRAMUSCULAR
  Filled 2015-08-30: qty 250

## 2015-08-30 NOTE — Discharge Instructions (Signed)
You were seen today for your vaginal discharge.  You do have Bacterial Vaginosis but there was some concern for STD and so cultures were sent but you were already treated for this here today.  Make sure all of your sexual partners are evaluated for STD as well and do not have sexual relations with them until they are.  Follow up with your OBGYN regarding your recurrent BV.  Bacterial Vaginosis Bacterial vaginosis is a vaginal infection that occurs when the normal balance of bacteria in the vagina is disrupted. It results from an overgrowth of certain bacteria. This is the most common vaginal infection in women of childbearing age. Treatment is important to prevent complications, especially in pregnant women, as it can cause a premature delivery. CAUSES  Bacterial vaginosis is caused by an increase in harmful bacteria that are normally present in smaller amounts in the vagina. Several different kinds of bacteria can cause bacterial vaginosis. However, the reason that the condition develops is not fully understood. RISK FACTORS Certain activities or behaviors can put you at an increased risk of developing bacterial vaginosis, including:  Having a new sex partner or multiple sex partners.  Douching.  Using an intrauterine device (IUD) for contraception. Women do not get bacterial vaginosis from toilet seats, bedding, swimming pools, or contact with objects around them. SIGNS AND SYMPTOMS  Some women with bacterial vaginosis have no signs or symptoms. Common symptoms include:  Grey vaginal discharge.  A fishlike odor with discharge, especially after sexual intercourse.  Itching or burning of the vagina and vulva.  Burning or pain with urination. DIAGNOSIS  Your health care provider will take a medical history and examine the vagina for signs of bacterial vaginosis. A sample of vaginal fluid may be taken. Your health care provider will look at this sample under a microscope to check for bacteria  and abnormal cells. A vaginal pH test may also be done.  TREATMENT  Bacterial vaginosis may be treated with antibiotic medicines. These may be given in the form of a pill or a vaginal cream. A second round of antibiotics may be prescribed if the condition comes back after treatment. Because bacterial vaginosis increases your risk for sexually transmitted diseases, getting treated can help reduce your risk for chlamydia, gonorrhea, HIV, and herpes. HOME CARE INSTRUCTIONS   Only take over-the-counter or prescription medicines as directed by your health care provider.  If antibiotic medicine was prescribed, take it as directed. Make sure you finish it even if you start to feel better.  Tell all sexual partners that you have a vaginal infection. They should see their health care provider and be treated if they have problems, such as a mild rash or itching.  During treatment, it is important that you follow these instructions:  Avoid sexual activity or use condoms correctly.  Do not douche.  Avoid alcohol as directed by your health care provider.  Avoid breastfeeding as directed by your health care provider. SEEK MEDICAL CARE IF:   Your symptoms are not improving after 3 days of treatment.  You have increased discharge or pain.  You have a fever. MAKE SURE YOU:     Understand these instructions.  Will watch your condition.  Will get help right away if you are not doing well or get worse. FOR MORE INFORMATION  Centers for Disease Control and Prevention, Division of STD Prevention: SolutionApps.co.zawww.cdc.gov/std American Sexual Health Association (ASHA): www.ashastd.org    This information is not intended to replace advice given to you  by your health care provider. Make sure you discuss any questions you have with your health care provider.   Document Released: 09/11/2005 Document Revised: 10/02/2014 Document Reviewed: 04/23/2013 Elsevier Interactive Patient Education 2016 [employer].   Sexually Transmitted Disease A sexually transmitted disease (STD) is a disease or infection that may be passed (transmitted) from person to person, usually during sexual activity. This may happen by way of saliva, semen, blood, vaginal mucus, or urine. Common STDs include:  Gonorrhea.  Chlamydia.  Syphilis.  HIV and AIDS.  Genital herpes.  Hepatitis B and C.  Trichomonas.  Human papillomavirus (HPV).  Pubic lice.  Scabies.  Mites.  Bacterial vaginosis. WHAT ARE CAUSES OF STDs? An STD may be caused by bacteria, a virus, or parasites. STDs are often transmitted during sexual activity if one person is infected. However, they may also be transmitted through nonsexual means. STDs may be transmitted after:   Sexual intercourse with an infected person.  Sharing sex toys with an infected person.  Sharing needles with an infected person or using unclean piercing or tattoo needles.  Having intimate contact with the genitals, mouth, or rectal areas of an infected person.  Exposure to infected fluids during birth. WHAT ARE THE SIGNS AND SYMPTOMS OF STDs? Different STDs have different symptoms. Some people may not have any symptoms. If symptoms are present, they may include:  Painful or bloody urination.  Pain in the pelvis, abdomen, vagina, anus, throat, or eyes.  A skin rash, itching, or irritation.  Growths, ulcerations, blisters, or sores in the genital and anal areas.  Abnormal vaginal discharge with or without bad odor.  Penile discharge in men.  Fever.  Pain or bleeding during sexual intercourse.  Swollen glands in the groin area.  Yellow skin and eyes (jaundice). This is seen with hepatitis.  Swollen testicles.  Infertility.  Sores and blisters in the mouth. HOW ARE STDs DIAGNOSED? To make a diagnosis, your health care provider may:  Take a medical history.  Perform a physical exam.  Take a sample of any discharge to examine.  Swab the  throat, cervix, opening to the penis, rectum, or vagina for testing.  Test a sample of your first morning urine.  Perform blood tests.  Perform a Pap test, if this applies.  Perform a colposcopy.  Perform a laparoscopy. HOW ARE STDs TREATED? Treatment depends on the STD. Some STDs may be treated but not cured.  Chlamydia, gonorrhea, trichomonas, and syphilis can be cured with antibiotic medicine.  Genital herpes, hepatitis, and HIV can be treated, but not cured, with prescribed medicines. The medicines lessen symptoms.  Genital warts from HPV can be treated with medicine or by freezing, burning (electrocautery), or surgery. Warts may come back.  HPV cannot be cured with medicine or surgery. However, abnormal areas may be removed from the cervix, vagina, or vulva.  If your diagnosis is confirmed, your recent sexual partners need treatment. This is true even if they are symptom-free or have a negative culture or evaluation. They should not have sex until their health care providers say it is okay.  Your health care provider may test you for infection again 3 months after treatment. HOW CAN I REDUCE MY RISK OF GETTING AN STD? Take these steps to reduce your risk of getting an STD:  Use latex condoms, dental dams, and water-soluble lubricants during sexual activity. Do not use petroleum jelly or oils.  Avoid having multiple sex partners.  Do not have sex with someone who  has other sex partners  Do not have sex with anyone you do not know or who is at high risk for an STD.  Avoid risky sex practices that can break your skin.  Do not have sex if you have open sores on your mouth or skin.  Avoid drinking too much alcohol or taking illegal drugs. Alcohol and drugs can affect your judgment and put you in a vulnerable position.  Avoid engaging in oral and anal sex acts.  Get vaccinated for HPV and hepatitis. If you have not received these vaccines in the past, talk to your health  care provider about whether one or both might be right for you.  If you are at risk of being infected with HIV, it is recommended that you take a prescription medicine daily to prevent HIV infection. This is called pre-exposure prophylaxis (PrEP). You are considered at risk if:  You are a man who has sex with other men (MSM).  You are a heterosexual man or woman and are sexually active with more than one partner.  You take drugs by injection.  You are sexually active with a partner who has HIV.  Talk with your health care provider about whether you are at high risk of being infected with HIV. If you choose to begin PrEP, you should first be tested for HIV. You should then be tested every 3 months for as long as you are taking PrEP. WHAT SHOULD I DO IF I THINK I HAVE AN STD?  See your health care provider.  Tell your sexual partner(s). They should be tested and treated for any STDs.  Do not have sex until your health care provider says it is okay. WHEN SHOULD I GET IMMEDIATE MEDICAL CARE? Contact your health care provider right away if:   You have severe abdominal pain.  You are a man and notice swelling or pain in your testicles.  You are a woman and notice swelling or pain in your vagina.   This information is not intended to replace advice given to you by your health care provider. Make sure you discuss any questions you have with your health care provider.   Document Released: 12/02/2002 Document Revised: 10/02/2014 Document Reviewed: 04/01/2013 Elsevier Interactive Patient Education [employer]

## 2015-08-30 NOTE — ED Notes (Signed)
States she has BV for the 6th time this year.

## 2015-08-30 NOTE — ED Provider Notes (Signed)
CSN: [id number]     Arrival date & time 08/30/15  2018 History  By signing my name below, I, Tanda RockersMargaux Venter, attest that this documentation has been prepared under the direction and in the presence of Leta BaptistEmily Roe Nguyen, MD. Electronically Signed: Tanda RockersMargaux Venter, ED Scribe. 08/30/2015. 11:04 PM.   Chief Complaint  Patient presents with  . Vaginal Discharge   The history is provided by the patient. No language interpreter was used.     HPI Comments: Jennifer Paul is a 19 y.o. female who presents to the Emergency Department complaining of gradual onset, constant, vaginal discharge that began a couple of days ago. Pt has hx of recurrent BV and states this is the 6th time she is having these symptoms. Pt states that she usually gets these symptoms one day after having sex. She has one sexual partner but reports not using a condom. Pt denies risk of STD exposure. Denies dysuria, hematuria, fever, chills, or any other associated symptoms.   History reviewed. No pertinent past medical history. History reviewed. No pertinent past surgical history. No family history on file. Social History  Substance Use Topics  . Smoking status: Never Smoker   . Smokeless tobacco: None  . Alcohol Use: No   OB History    No data available     Review of Systems  Constitutional: Negative for fever and chills.  Genitourinary: Positive for vaginal discharge. Negative for dysuria and hematuria.  All other systems reviewed and are negative.  Allergies  Review of patient's allergies indicates no known allergies.  Home Medications   Prior to Admission medications   Medication Sig Start Date End Date Taking? Authorizing Provider  amoxicillin (AMOXIL) 250 MG capsule Take 250 mg by mouth 3 (three) times daily.    Historical Provider, MD  clindamycin (CLEOCIN) 150 MG capsule Take 2 capsules (300 mg total) by mouth 3 (three) times daily. May dispense as 150mg  capsules 03/01/15   Francee PiccoloJennifer Piepenbrink, PA-C  magic  mouthwash w/lidocaine SOLN Take 15 mLs by mouth 3 (three) times daily as needed for mouth pain. Swish and spit. 07/31/15   Garlon HatchetLisa M Sanders, PA-C  meloxicam (MOBIC) 7.5 MG tablet Take 2 tablets (15 mg total) by mouth daily. 03/01/15   Jennifer Piepenbrink, PA-C  metroNIDAZOLE (FLAGYL) 500 MG tablet Take 1 tablet (500 mg total) by mouth 2 (two) times daily. One po bid x 7 days 08/30/15   Leta BaptistEmily Roe Nguyen, MD  Prenatal Vit-Fe Fumarate-FA (PRENATAL COMPLETE) 14-0.4 MG TABS Take 1 tablet by mouth once. 10/30/12   Elson AreasLeslie K Sofia, PA-C  Probiotic Product (PROBIOTIC DAILY PO) Take by mouth.    Historical Provider, MD   Triage Vitals: BP 108/71 mmHg  Pulse 81  Temp(Src) 98.2 F (36.8 C) (Oral)  Resp 18  Ht 5\' 4"  (1.626 m)  Wt 170 lb (77.111 kg)  BMI 29.17 kg/m2  SpO2 100%   Physical Exam  Constitutional: She is oriented to person, place, and time. She appears well-developed and well-nourished. No distress.  HENT:  Head: Normocephalic and atraumatic.  Right Ear: External ear normal.  Left Ear: External ear normal.  Nose: Nose normal.  Mouth/Throat: Oropharynx is clear and moist. No oropharyngeal exudate.  Eyes: Conjunctivae and EOM are normal. Pupils are equal, round, and reactive to light.  Neck: Normal range of motion. Neck supple. No tracheal deviation present.  Cardiovascular: Normal rate, regular rhythm, normal heart sounds and intact distal pulses.   No murmur heard. Pulmonary/Chest: Effort normal. No respiratory distress. She  has no wheezes. She has no rales.  Abdominal: Soft. She exhibits no distension. There is no tenderness. Hernia confirmed negative in the right inguinal area and confirmed negative in the left inguinal area.  Genitourinary: Uterus normal. Cervix exhibits discharge and friability. Cervix exhibits no motion tenderness. Right adnexum displays no mass, no tenderness and no fullness. Left adnexum displays no mass, no tenderness and no fullness. No bleeding in the vagina. No  foreign body around the vagina. Vaginal discharge (yellowish, foul smelling) found.  Musculoskeletal: Normal range of motion. She exhibits no edema or tenderness.  Neurological: She is alert and oriented to person, place, and time.  Skin: Skin is warm and dry. No rash noted. She is not diaphoretic.  Psychiatric: She has a normal mood and affect. Her behavior is normal.  Nursing note and vitals reviewed.   ED Course  Procedures (including critical care time)  DIAGNOSTIC STUDIES: Oxygen Saturation is 100% on RA, normal by my interpretation.    COORDINATION OF CARE: 11:01 PM-Discussed treatment plan with pt at bedside and pt agreed to plan.   Labs Review Labs Reviewed  WET PREP, GENITAL - Abnormal; Notable for the following:    Clue Cells Wet Prep HPF POC PRESENT (*)    WBC, Wet Prep HPF POC MANY (*)    All other components within normal limits  URINALYSIS, ROUTINE W REFLEX MICROSCOPIC (NOT AT [facility]) - Abnormal; Notable for the following:    APPearance CLOUDY (*)    Leukocytes, UA SMALL (*)    All other components within normal limits  URINE MICROSCOPIC-ADD ON - Abnormal; Notable for the following:    Squamous Epithelial / LPF 0-5 (*)    Bacteria, UA RARE (*)    All other components within normal limits  PREGNANCY, URINE  GC/CHLAMYDIA PROBE AMP ([place]) NOT AT [facility]    Imaging Review No results found. I have personally reviewed and evaluated these lab results as part of my medical decision-making.   EKG Interpretation None      MDM  Patient seen and evaluated in stable condition.  Physical examination concerning for possible STD.  Wet prep consistent with BV.  Patient treated with Rocephin and Azitrhomycin and given first dose of flagyl.  Patient discharged home in stable condition with instruction on safe sexual practices, need for sexual partners to be evaluated, and prescription for flagyl and instruction to follow up with her OBGYN regarding recurrent BV. Final  diagnoses:  Bacterial vaginitis   1. Bacterial vaginitis  2. Encounter for concern for STD  I personally performed the services described in this documentation, which was scribed in my presence. The recorded information has been reviewed and is accurate.      Leta Baptist, MD 08/31/15 2120

## 2015-08-31 LAB — GC/CHLAMYDIA PROBE AMP (~~LOC~~) NOT AT ARMC
CHLAMYDIA, DNA PROBE: NEGATIVE
NEISSERIA GONORRHEA: NEGATIVE

## 2016-02-12 ENCOUNTER — Emergency Department (HOSPITAL_COMMUNITY)
Admission: EM | Admit: 2016-02-12 | Discharge: 2016-02-12 | Disposition: A | Discharge status: Home / Self Care | Payer: BLUE CROSS/BLUE SHIELD | Attending: Emergency Medicine | Admitting: Emergency Medicine

## 2016-02-12 ENCOUNTER — Encounter (HOSPITAL_COMMUNITY): Payer: Self-pay | Admitting: Oncology

## 2016-02-12 DIAGNOSIS — L2381 Allergic contact dermatitis due to animal (cat) (dog) dander: Secondary | ICD-10-CM | POA: Insufficient documentation

## 2016-02-12 DIAGNOSIS — Z79899 Other long term (current) drug therapy: Secondary | ICD-10-CM | POA: Diagnosis not present

## 2016-02-12 DIAGNOSIS — J3081 Allergic rhinitis due to animal (cat) (dog) hair and dander: Secondary | ICD-10-CM

## 2016-02-12 DIAGNOSIS — T7840XA Allergy, unspecified, initial encounter: Secondary | ICD-10-CM | POA: Diagnosis present

## 2016-02-12 MED ORDER — FAMOTIDINE 20 MG PO TABS
20.0000 mg | ORAL_TABLET | Freq: Once | ORAL | Status: AC
  Administered 2016-02-12: 20 mg via ORAL
  Filled 2016-02-12: qty 1

## 2016-02-12 MED ORDER — DIPHENHYDRAMINE HCL 25 MG PO CAPS
50.0000 mg | ORAL_CAPSULE | Freq: Once | ORAL | Status: AC
  Administered 2016-02-12: 50 mg via ORAL
  Filled 2016-02-12: qty 2

## 2016-02-12 NOTE — ED Provider Notes (Signed)
CSN: [id number]     Arrival date & time 02/12/16  0144 History  By signing my name below, I, Placido Sou, attest that this documentation has been prepared under the direction and in the presence of Gilda Crease, MD. Electronically Signed: Placido Sou, ED Scribe. 02/12/2016. 3:06 AM.   Chief Complaint  Patient presents with  . Allergic Reaction   The history is provided by the patient. No language interpreter was used.    HPI Comments: Jennifer Paul is a 20 y.o. female who presents to the Emergency Department complaining of gradual onset, mild, bilateral, left greater than right, periorbital swelling onset PTA. Pt states that she was petting a dog and began experiencing her facial swelling with associated, nasal congestion, eye redness, sneezing, bilateral UE itchiness and throat itchiness. She denies a hx of similar symptoms with pet exposure. Pt denies taking any medications for her symptoms. Pt denies any other associated symptoms at this time.    History reviewed. No pertinent past medical history. History reviewed. No pertinent past surgical history. No family history on file. Social History  Substance Use Topics  . Smoking status: Never Smoker   . Smokeless tobacco: Never Used  . Alcohol Use: No   OB History    No data available     Review of Systems  HENT: Positive for congestion, facial swelling and sneezing.   Eyes: Positive for redness.  Skin: Positive for rash.  All other systems reviewed and are negative.   Allergies  Review of patient's allergies indicates no known allergies.  Home Medications   Prior to Admission medications   Medication Sig Start Date End Date Taking? Authorizing Provider  ibuprofen (ADVIL,MOTRIN) 200 MG tablet Take 600 mg by mouth every 6 (six) hours as needed for headache or moderate pain.   Yes Historical Provider, MD   BP 103/52 mmHg  Pulse 82  Temp(Src) 98.1 F (36.7 C) (Oral)  Resp 14  Ht  (1.626 m)  Wt 174  lb (78.926 kg)  BMI 29.85 kg/m2  SpO2 100%    Physical Exam  Constitutional: She is oriented to person, place, and time. She appears well-developed and well-nourished. No distress.  HENT:  Head: Normocephalic and atraumatic.  Right Ear: Hearing normal.  Left Ear: Hearing normal.  Nose: Nose normal.  Mouth/Throat: Oropharynx is clear and moist and mucous membranes are normal.  Bilateral, left greater than right, periorbital edema and conjunctival injection  Eyes: EOM are normal. Pupils are equal, round, and reactive to light. Right conjunctiva is injected. Left conjunctiva is injected.  Neck: Normal range of motion. Neck supple.  Cardiovascular: Regular rhythm, S1 normal and S2 normal.  Exam reveals no gallop and no friction rub.   No murmur heard. Pulmonary/Chest: Effort normal and breath sounds normal. No respiratory distress. She exhibits no tenderness.  Abdominal: Soft. Normal appearance and bowel sounds are normal. There is no hepatosplenomegaly. There is no tenderness. There is no rebound, no guarding, no tenderness at McBurney's point and negative Murphy's sign. No hernia.  Musculoskeletal: Normal range of motion.  Neurological: She is alert and oriented to person, place, and time. She has normal strength. No cranial nerve deficit or sensory deficit. Coordination normal. GCS eye subscore is 4. GCS verbal subscore is 5. GCS motor subscore is 6.  Skin: Skin is warm, dry and intact. No rash noted. No cyanosis.  Psychiatric: She has a normal mood and affect. Her speech is normal and behavior is normal. Thought content normal.  Nursing  note and vitals reviewed.   ED Course  Procedures  DIAGNOSTIC STUDIES: Oxygen Saturation is 100% on RA, normal by my interpretation.    COORDINATION OF CARE: 2:51 AM Discussed next steps with pt. She verbalized understanding and is agreeable with the plan.   Labs Review Labs Reviewed - No data to display  Imaging Review No results found.   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 Patient was petting a dog and then suddenly began to develop itching and swelling of her eyes with runny nose and itchy throat. Patient to minister Benadryl and Pepcid by mouth here in the ER and watched. No progression of symptoms. No evidence of systemic allergic reaction. Continue symptomatically treatment at home as needed.  I personally performed the services described in this documentation, which was scribed in my presence. The recorded information has been reviewed and is accurate.    Gilda Creasehristopher J Pollina, MD 02/12/16 2308

## 2016-02-12 NOTE — ED Notes (Signed)
Pt was at a friend's house around 0030 who has a dog she was petting.  Approximately 30 minutes later pt developed swelling/redness and itching to left eye, sneezing, runny nose as well as feeling like her throat is closing.  Pt is speaking in full sentences and on the phone in triage.  Did not take any OTC medication prior to arrival.  Pt states she has been around dogs all of her life w/o ever having this type of reaction.

## 2016-02-12 NOTE — Discharge Instructions (Signed)
Allergic Conjunctivitis Allergic conjunctivitis is inflammation of the clear membrane that covers the white part of your eye and the inner surface of your eyelid (conjunctiva), and it is caused by allergies. The blood vessels in the conjunctiva become inflamed, and this causes the eye to become red or pink, and it often causes itchiness in the eye. Allergic conjunctivitis cannot be spread by one person to another person (noncontagious). CAUSES This condition is caused by an allergic reaction. Common causes of an allergic reaction (allergens) include:  Dust.  Pollen.  Mold.  Animal dander or secretions. RISK FACTORS This condition is more likely to develop if you are exposed to high levels of allergens that cause the allergic reaction. This might include being outdoors when air pollen levels are high or being around animals that you are allergic to. SYMPTOMS Symptoms of this condition may include:  Eye redness.  Tearing of the eyes.  Watery eyes.  Itchy eyes.  Burning feeling in the eyes.  Clear drainage from the eyes.  Swollen eyelids. DIAGNOSIS This condition may be diagnosed by medical history and physical exam. If you have drainage from your eyes, it may be tested to rule out other causes of conjunctivitis. TREATMENT Treatment for this condition often includes medicines. These may be eye drops, ointments, or oral medicines. They may be prescription medicines or over-the-counter medicines. HOME CARE INSTRUCTIONS  Take or apply medicines only as directed by your health care provider.  Do not touch or rub your eyes.  Do not wear contact lenses until the inflammation is gone. Wear glasses instead.  Do not wear eye makeup until the inflammation is gone.  Apply a cool, clean washcloth to your eye for 10-20 minutes, 3-4 times a day.  Try to avoid whatever allergen is causing the allergic reaction. SEEK MEDICAL CARE IF:  Your symptoms get worse.  You have pus draining  from your eye.  You have new symptoms.  You have a fever.   This information is not intended to replace advice given to you by your health care provider. Make sure you discuss any questions you have with your health care provider.   Document Released: 12/02/2002 Document Revised: 10/02/2014 Document Reviewed: 06/23/2014 Elsevier Interactive Patient Education 2016 [employer]  

## 2016-03-14 ENCOUNTER — Encounter (HOSPITAL_BASED_OUTPATIENT_CLINIC_OR_DEPARTMENT_OTHER): Payer: Self-pay

## 2016-03-14 ENCOUNTER — Emergency Department (HOSPITAL_BASED_OUTPATIENT_CLINIC_OR_DEPARTMENT_OTHER)
Admission: EM | Admit: 2016-03-14 | Discharge: 2016-03-14 | Disposition: A | Discharge status: Home / Self Care | Payer: BLUE CROSS/BLUE SHIELD | Attending: Emergency Medicine | Admitting: Emergency Medicine

## 2016-03-14 DIAGNOSIS — S90862A Insect bite (nonvenomous), left foot, initial encounter: Secondary | ICD-10-CM | POA: Insufficient documentation

## 2016-03-14 DIAGNOSIS — Y92009 Unspecified place in unspecified non-institutional (private) residence as the place of occurrence of the external cause: Secondary | ICD-10-CM | POA: Insufficient documentation

## 2016-03-14 DIAGNOSIS — Y999 Unspecified external cause status: Secondary | ICD-10-CM | POA: Insufficient documentation

## 2016-03-14 DIAGNOSIS — Y9301 Activity, walking, marching and hiking: Secondary | ICD-10-CM | POA: Insufficient documentation

## 2016-03-14 DIAGNOSIS — W57XXXA Bitten or stung by nonvenomous insect and other nonvenomous arthropods, initial encounter: Secondary | ICD-10-CM | POA: Diagnosis not present

## 2016-03-14 MED ORDER — IBUPROFEN 200 MG PO TABS: 600.0000 mg | ORAL_TABLET | Freq: Four times a day (QID) | ORAL | Status: DC | PRN

## 2016-03-14 MED ORDER — DIPHENHYDRAMINE HCL 25 MG PO CAPS: 25.0000 mg | ORAL_CAPSULE | Freq: Four times a day (QID) | ORAL | Status: DC | PRN

## 2016-03-14 NOTE — ED Notes (Signed)
Pt c/o ?spider bite to the top of left foot on Sunday, c/o pain and swelling tonight

## 2016-03-14 NOTE — ED Provider Notes (Signed)
CSN: [id number]     Arrival date & time 03/14/16  0253 History   First MD Initiated Contact with Patient 03/14/16 [phone]     Chief Complaint  Patient presents with  . Insect Bite     (Consider location/radiation/quality/duration/timing/severity/associated sxs/prior Treatment) HPI  [name] who presents with blood bites left foot. Patient states that last night she was walking with sandals into her house when she felt something bite her foot. She had increasing pain today while she was at work and wearing tight shoes. She noted multiple bite marks. She states that they are burning and itchy. She's not taken anything for her symptoms. Current pain is 2 out of 10. Pain is worse with tight fitting shoes.  History reviewed. No pertinent past medical history. History reviewed. No pertinent past surgical history. No family history on file. Social History  Substance Use Topics  . Smoking status: Never Smoker   . Smokeless tobacco: Never Used  . Alcohol Use: No   OB History    No data available     Review of Systems  Skin:       Bug bite  All other systems reviewed and are negative.     Allergies  Review of patient's allergies indicates no known allergies.  Home Medications   Prior to Admission medications   Medication Sig Start Date End Date Taking? Authorizing Provider  diphenhydrAMINE (BENADRYL) 25 mg capsule Take 1 capsule (25 mg total) by mouth every 6 (six) hours as needed. 03/14/16   Shon Batonourtney F Deontrey Massi, MD  ibuprofen (ADVIL,MOTRIN) 200 MG tablet Take 3 tablets (600 mg total) by mouth every 6 (six) hours as needed for headache or moderate pain. 03/14/16   Shon Batonourtney F Markevion Lattin, MD   BP 122/76 mmHg  Pulse 89  Temp(Src) 98.2 F (36.8 C)  Resp 18  SpO2 100% Physical Exam  Constitutional: She is oriented to person, place, and time. She appears well-developed and well-nourished. No distress.  HENT:  Head: Normocephalic and atraumatic.  Cardiovascular: Normal  rate and regular rhythm.   Pulmonary/Chest: Effort normal. No respiratory distress.  Neurological: She is alert and oriented to person, place, and time.  Skin: Skin is warm and dry.  3 distinct raised bites over the dorsum of the left foot near the ankle, midfoot, and fourth toe, no significant erythema, mild swelling, no induration  Psychiatric: She has a normal mood and affect.  Nursing note and vitals reviewed.   ED Course  Procedures (including critical care time) Labs Review Labs Reviewed - No data to display  Imaging Review No results found. I have personally reviewed and evaluated these images and lab results as part of my medical decision-making.   EKG Interpretation None      MDM   Final diagnoses:  Insect bite    Patient presents with both active left foot. Nontoxic on exam. Given the description of burning and itching, suspect fryer ants or other insect. There is no evidence of infection. Benadryl and ibuprofen for comfort.  After history, exam, and medical workup I feel the patient has been appropriately medically screened and is safe for discharge home. Pertinent diagnoses were discussed with the patient. Patient was given return precautions.     Shon Batonourtney F Terianne Thaker, MD 03/14/16 [phone]

## 2016-03-14 NOTE — Discharge Instructions (Signed)
Insect Bite Mosquitoes, flies, fleas, bedbugs, and many other insects can bite. Insect bites are different from insect stings. A sting is when poison (venom) is injected into the skin. Insect bites can cause pain or itching for a few days, but they are usually not serious. Some insects can spread diseases to people through a bite. SYMPTOMS  Symptoms of an insect bite include:  Itching or pain in the bite area.  Redness and swelling in the bite area.  An open wound (skin ulcer). In many cases, symptoms last for 2-4 days.  DIAGNOSIS  This condition is usually diagnosed based on symptoms and a physical exam. TREATMENT  Treatment is usually not needed for an insect bite. Symptoms often go away on their own. Your health care provider may recommend creams or lotions to help reduce itching. Antibiotic medicines may be prescribed if the bite becomes infected. A tetanus shot may be given in some cases. If you develop an allergic reaction to an insect bite, your health care provider will prescribe medicines to treat the reaction (antihistamines). This is rare. HOME CARE INSTRUCTIONS  Do not scratch the bite area.  Keep the bite area clean and dry. Wash the bite area daily with soap and water as told by your health care provider.  If directed, applyice to the bite area.  Put ice in a plastic bag.  Place a towel between your skin and the bag.  Leave the ice on for 20 minutes, 2-3 times per day.  To help reduce itching and swelling, try applying a baking soda paste, cortisone cream, or calamine lotion to the bite area as told by your health care provider.  Apply or take over-the-counter and prescription medicines only as told by your health care provider.  If you were prescribed an antibiotic medicine, use it as told by your health care provider. Do not stop using the antibiotic even if your condition improves.  Keep all follow-up visits as told by your health care provider. This is  important. PREVENTION   Use insect repellent. The best insect repellents contain:  DEET, picaridin, oil of lemon eucalyptus (OLE), or IR3535.  Higher amounts of an active ingredient.  When you are outdoors, wear clothing that covers your arms and legs.  Avoid opening windows that do not have window screens. SEEK MEDICAL CARE IF:  You have increased redness, swelling, or pain in the bite area.  You have a fever. SEEK IMMEDIATE MEDICAL CARE IF:   You have joint pain.   You have fluid, blood, or pus coming from the bite area.  You have a headache or neck pain.  You have unusual weakness.  You have a rash.  You have chest pain or shortness of breath.  You have abdominal pain, nausea, or vomiting.  You feel unusually tired or sleepy.   This information is not intended to replace advice given to you by your health care provider. Make sure you discuss any questions you have with your health care provider.   Document Released: 10/19/2004 Document Revised: 06/02/2015 Document Reviewed: 01/27/2015 Elsevier Interactive Patient Education 2016 [employer]  

## 2016-11-04 ENCOUNTER — Emergency Department (HOSPITAL_BASED_OUTPATIENT_CLINIC_OR_DEPARTMENT_OTHER)
Admission: EM | Admit: 2016-11-04 | Discharge: 2016-11-04 | Disposition: A | Discharge status: Home / Self Care | Payer: BLUE CROSS/BLUE SHIELD | Attending: Emergency Medicine | Admitting: Emergency Medicine

## 2016-11-04 ENCOUNTER — Encounter (HOSPITAL_BASED_OUTPATIENT_CLINIC_OR_DEPARTMENT_OTHER): Payer: Self-pay | Admitting: *Deleted

## 2016-11-04 DIAGNOSIS — B354 Tinea corporis: Secondary | ICD-10-CM | POA: Diagnosis not present

## 2016-11-04 DIAGNOSIS — R21 Rash and other nonspecific skin eruption: Secondary | ICD-10-CM

## 2016-11-04 MED ORDER — ECONAZOLE NITRATE 1 % EX CREA: TOPICAL_CREAM | Freq: Every day | CUTANEOUS | 0 refills | Status: DC

## 2016-11-04 NOTE — ED Provider Notes (Signed)
[facility]   CSN: [id number] Arrival date & time: 11/04/16  [id number]     History   Chief Complaint Chief Complaint  Patient presents with  . Rash    HPI Jennifer Paul is a 21 y.o. female.  HPI  20yo female presents with concern for rash.  Had tinea 2 mos ago, placed cream and had skin color change over area, then developed other lesions of similar ring worm 3 days ago and also developed itchy rash over abdomen, chest and back.  No associatd symptoms. No sick contacts.  No fevers. No similar rash. Recently switched body wash but has used this before, no other pet exposures.   History reviewed. No pertinent past medical history.  There are no active problems to display for this patient.   History reviewed. No pertinent surgical history.  OB History    No data available       Home Medications    Prior to Admission medications   Medication Sig Start Date End Date Taking? Authorizing Provider  diphenhydrAMINE (BENADRYL) 25 mg capsule Take 1 capsule (25 mg total) by mouth every 6 (six) hours as needed. 03/14/16   Shon Batonourtney F Horton, MD  econazole nitrate 1 % cream Apply topically daily. Until lesions resolve. 11/04/16   Alvira MondayErin Cadan Maggart, MD  ibuprofen (ADVIL,MOTRIN) 200 MG tablet Take 3 tablets (600 mg total) by mouth every 6 (six) hours as needed for headache or moderate pain. 03/14/16   Shon Batonourtney F Horton, MD    Family History History reviewed. No pertinent family history.  Social History Social History  Substance Use Topics  . Smoking status: Never Smoker  . Smokeless tobacco: Never Used  . Alcohol use No     Allergies   Patient has no known allergies.   Review of Systems Review of Systems  Constitutional: Negative for fever.  HENT: Negative for sore throat.   Eyes: Negative for visual disturbance.  Respiratory: Negative for cough and shortness of breath.   Cardiovascular: Negative for chest pain.  Gastrointestinal: Negative for  abdominal pain, nausea and vomiting.  Genitourinary: Negative for difficulty urinating.  Musculoskeletal: Negative for back pain and neck pain.  Skin: Positive for rash.  Neurological: Negative for syncope and headaches.     Physical Exam Updated Vital Signs BP 110/69 (BP Location: Left Arm)   Pulse 89   Temp 98.2 F (36.8 C) (Oral)   Resp 16   Ht 5\' 4"  (1.626 m)   Wt 185 lb (83.9 kg)   SpO2 99%   BMI 31.76 kg/m   Physical Exam  Constitutional: She is oriented to person, place, and time. She appears well-developed and well-nourished. No distress.  HENT:  Head: Normocephalic and atraumatic.  Eyes: Conjunctivae and EOM are normal.  Neck: Normal range of motion.  Cardiovascular: Normal rate, regular rhythm, normal heart sounds and intact distal pulses.  Exam reveals no gallop and no friction rub.   No murmur heard. Pulmonary/Chest: Effort normal and breath sounds normal. No respiratory distress. She has no wheezes. She has no rales.  Musculoskeletal: She exhibits no edema or tenderness.  Neurological: She is alert and oriented to person, place, and time.  Skin: Skin is warm and dry. Rash (small erythematous papules over abdomen and chest, no surrounding erythema, tiny skin colored papules,erythematous patches, round with scaling and anullar appearance to right forearm and right shoulder) noted. She is not diaphoretic. No erythema.  Nursing note and vitals reviewed.    ED Treatments / Results  Labs (all labs ordered are listed, but only abnormal results are displayed) Labs Reviewed - No data to display  EKG  EKG Interpretation None       Radiology No results found.  Procedures Procedures (including critical care time)  Medications Ordered in ED Medications - No data to display   Initial Impression / Assessment and Plan / ED Course  I have reviewed the triage vital signs and the nursing notes.  Pertinent labs & imaging results that were available during my  care of the patient were reviewed by me and considered in my medical decision making (see chart for details).    21 year old female presents with concern for rash. Rash has the appearance of tinea corporis and some areas, and we'll give prescription for etoconazole. She also has papules scattered over her abdomen, more likely allergic or contact dermatitis. Not localized to suspect majocchi's granuloma.  Rash does not have the appearance of SSS, TEN, erythroderma, scabies, RMSF or hives. Recommend cream for tinea, symptomatic treatment for other rash, monitoring for infection and pcp follow up.  Final Clinical Impressions(s) / ED Diagnoses   Final diagnoses:  Tinea corporis  Rash    New Prescriptions New Prescriptions   ECONAZOLE NITRATE 1 % CREAM    Apply topically daily. Until lesions resolve.     Alvira Monday, MD 11/04/16 0730

## 2016-11-04 NOTE — ED Notes (Signed)
Pt alert, NAD, calm, interactive, resps e/u, speaking in clear complete sentences, voice hoarse, no dyspnea noted, c/o fine red raised scattered itchy rash (denies: fever, HA, nvd, constipation, dizziness, or other sx), had tried antifungal cream sporadically once daily, "feel like L FA ringworm was not treated completely 2 months ago, and now has spread to rest of body in the last 3d".

## 2016-11-04 NOTE — ED Notes (Signed)
Pt made aware to return if symptoms worsen or if any life threatening symptoms occur.   

## 2016-11-04 NOTE — Discharge Instructions (Signed)
Take benadryl for itching, use cream for lesions, and follow up with your doctor for reevaluation of rash.

## 2016-12-15 ENCOUNTER — Encounter (HOSPITAL_BASED_OUTPATIENT_CLINIC_OR_DEPARTMENT_OTHER): Payer: Self-pay | Admitting: *Deleted

## 2016-12-15 ENCOUNTER — Emergency Department (HOSPITAL_BASED_OUTPATIENT_CLINIC_OR_DEPARTMENT_OTHER)
Admission: EM | Admit: 2016-12-15 | Discharge: 2016-12-15 | Disposition: A | Discharge status: Home / Self Care | Payer: BLUE CROSS/BLUE SHIELD | Attending: Emergency Medicine | Admitting: Emergency Medicine

## 2016-12-15 DIAGNOSIS — N760 Acute vaginitis: Secondary | ICD-10-CM | POA: Diagnosis not present

## 2016-12-15 DIAGNOSIS — N939 Abnormal uterine and vaginal bleeding, unspecified: Secondary | ICD-10-CM | POA: Diagnosis present

## 2016-12-15 DIAGNOSIS — B9689 Other specified bacterial agents as the cause of diseases classified elsewhere: Secondary | ICD-10-CM

## 2016-12-15 DIAGNOSIS — Z202 Contact with and (suspected) exposure to infections with a predominantly sexual mode of transmission: Secondary | ICD-10-CM | POA: Diagnosis not present

## 2016-12-15 LAB — URINALYSIS, MICROSCOPIC (REFLEX)
RBC / HPF: NONE SEEN RBC/hpf (ref 0–5)
WBC UA: NONE SEEN WBC/hpf (ref 0–5)

## 2016-12-15 LAB — URINALYSIS, ROUTINE W REFLEX MICROSCOPIC
Bilirubin Urine: NEGATIVE
GLUCOSE, UA: NEGATIVE mg/dL
Hgb urine dipstick: NEGATIVE
Ketones, ur: NEGATIVE mg/dL
Leukocytes, UA: NEGATIVE
Nitrite: NEGATIVE
PH: 7.5 (ref 5.0–8.0)
PROTEIN: NEGATIVE mg/dL
SPECIFIC GRAVITY, URINE: 1.024 (ref 1.005–1.030)

## 2016-12-15 LAB — WET PREP, GENITAL
Sperm: NONE SEEN
Trich, Wet Prep: NONE SEEN
Yeast Wet Prep HPF POC: NONE SEEN

## 2016-12-15 LAB — HEMOGLOBIN AND HEMATOCRIT, BLOOD
HEMATOCRIT: 37.2 % (ref 36.0–46.0)
Hemoglobin: 12 g/dL (ref 12.0–15.0)

## 2016-12-15 LAB — PREGNANCY, URINE: Preg Test, Ur: NEGATIVE

## 2016-12-15 MED ORDER — LIDOCAINE HCL (PF) 1 % IJ SOLN
INTRAMUSCULAR | Status: AC
  Filled 2016-12-15: qty 5

## 2016-12-15 MED ORDER — CEFTRIAXONE SODIUM 250 MG IJ SOLR
250.0000 mg | Freq: Once | INTRAMUSCULAR | Status: AC
  Administered 2016-12-15: 250 mg via INTRAMUSCULAR
  Filled 2016-12-15: qty 250

## 2016-12-15 MED ORDER — METRONIDAZOLE 500 MG PO TABS: 500.0000 mg | ORAL_TABLET | Freq: Two times a day (BID) | ORAL | 0 refills | Status: DC

## 2016-12-15 MED ORDER — AZITHROMYCIN 250 MG PO TABS
1000.0000 mg | ORAL_TABLET | Freq: Once | ORAL | Status: AC
  Administered 2016-12-15: 1000 mg via ORAL
  Filled 2016-12-15: qty 4

## 2016-12-15 NOTE — ED Provider Notes (Signed)
[facility]   CSN: [id number] Arrival date & time: 12/15/16  1310     History   Chief Complaint Chief Complaint  Patient presents with  . Headache  . Vaginal Discharge    HPI Jennifer Paul is a 21 y.o. female presents today with complaints of vaginal bleeding for 3 days. She states her vaginal bleeding is "light" "not like dark red". She states it's "not a lot but enough to see". She reports associated headache, dysuria, vaginal discharge, vaginal odor. She states her headache is mild and similar to previous headaches. She reports nothing makes her symptoms better/worse. She reports trying tylenol, ibuprofen, and aspirin with complete relief. She denies fever, chills, fatigue, weakness, back pain, vaginal pain, changes in vision, changes in ambulation. She reports having an IUD placed for the past 4 years. OB history G1P1. LMP irregular with IUD. She reports hx of STDs. She reports having a new sexual partner and recently having unprotected sexual intercourse. She is not aware of partners STD status. She states she had irregular bleeding like this before which was diagnosed as an STD. She states she sees her GYN regularly. She said her last visit was several months ago for check up where she was told "everything good" but was given flagyl for BV, however admits to being noncompliant with it and not finishing regimen.   The history is provided by the patient. No language interpreter was used.  Headache   Pertinent negatives include no fever, no shortness of breath, no nausea and no vomiting.  Vaginal Discharge   Associated symptoms include dysuria. Pertinent negatives include no fever, no diarrhea, no nausea and no vomiting.    History reviewed. No pertinent past medical history.  There are no active problems to display for this patient.   History reviewed. No pertinent surgical history.  OB History    No data available       Home Medications     Prior to Admission medications   Medication Sig Start Date End Date Taking? Authorizing Provider  diphenhydrAMINE (BENADRYL) 25 mg capsule Take 1 capsule (25 mg total) by mouth every 6 (six) hours as needed. 03/14/16   Shon Baton, MD  econazole nitrate 1 % cream Apply topically daily. Until lesions resolve. 11/04/16   Alvira Monday, MD  ibuprofen (ADVIL,MOTRIN) 200 MG tablet Take 3 tablets (600 mg total) by mouth every 6 (six) hours as needed for headache or moderate pain. 03/14/16   Shon Baton, MD  metroNIDAZOLE (FLAGYL) 500 MG tablet Take 1 tablet (500 mg total) by mouth 2 (two) times daily. 12/15/16   Hobie Kohles Orson Aloe, [place]    Family History No family history on file.  Social History Social History  Substance Use Topics  . Smoking status: Never Smoker  . Smokeless tobacco: Never Used  . Alcohol use No     Allergies   Patient has no known allergies.   Review of Systems Review of Systems  Constitutional: Negative for chills and fever.  Eyes: Negative for visual disturbance.  Respiratory: Negative for shortness of breath.   Cardiovascular: Negative for chest pain.  Gastrointestinal: Negative for diarrhea, nausea and vomiting.  Genitourinary: Positive for difficulty urinating, dysuria and vaginal discharge. Negative for hematuria.  Musculoskeletal: Negative for back pain.  Skin: Negative for wound.  Neurological: Positive for headaches.     Physical Exam Updated Vital Signs BP 114/75 (BP Location: Right Arm)   Pulse 81   Temp 98.3 F (36.8 C) (  Oral)   Resp 18   Ht 5\' 4"  (1.626 m)   Wt 83.9 kg   SpO2 100%   BMI 31.76 kg/m   Physical Exam  Constitutional: She appears well-developed and well-nourished.  Well appearing  HENT:  Head: Normocephalic and atraumatic.  Nose: Nose normal.  Eyes: Conjunctivae and EOM are normal.  Neck: Normal range of motion.  Cardiovascular: Normal rate, normal heart sounds and intact distal pulses.    Pulmonary/Chest: Effort normal. No respiratory distress.  Normal work of breathing. No respiratory distress noted.   Abdominal: Soft.  Genitourinary: Vagina normal.  Genitourinary Comments: Exam performed by Alvina Chou,  exam chaperoned Date: 12/15/2016 Pelvic exam: normal external genitalia without evidence of trauma. VULVA: normal appearing vulva with no masses, tenderness or lesion. VAGINA: normal appearing vagina with normal color and discharge, no lesions. CERVIX: normal appearing cervix without lesions, cervical motion tenderness absent, cervical os closed with out purulent discharge, IUD placement visualized; vaginal discharge - dark and bloody, Wet prep and DNA probe for chlamydia and GC obtained.   ADNEXA: normal adnexa in size, nontender and no masses UTERUS: uterus is normal size, shape, consistency and nontender.   Musculoskeletal: Normal range of motion.  Neurological: She is alert.  Skin: Skin is warm.  Psychiatric: She has a normal mood and affect. Her behavior is normal.  Nursing note and vitals reviewed.    ED Treatments / Results  Labs (all labs ordered are listed, but only abnormal results are displayed) Labs Reviewed  WET PREP, GENITAL - Abnormal; Notable for the following:       Result Value   Clue Cells Wet Prep HPF POC PRESENT (*)    WBC, Wet Prep HPF POC MANY (*)    All other components within normal limits  URINALYSIS, ROUTINE W REFLEX MICROSCOPIC - Abnormal; Notable for the following:    APPearance TURBID (*)    All other components within normal limits  URINALYSIS, MICROSCOPIC (REFLEX) - Abnormal; Notable for the following:    Bacteria, UA FEW (*)    Squamous Epithelial / LPF 0-5 (*)    All other components within normal limits  PREGNANCY, URINE  HEMOGLOBIN AND HEMATOCRIT, BLOOD  RPR  HIV ANTIBODY (ROUTINE TESTING)  GC/CHLAMYDIA PROBE AMP ([place]) NOT AT [facility]    EKG  EKG Interpretation None       Radiology No results  found.  Procedures Procedures (including critical care time)  Medications Ordered in ED Medications  cefTRIAXone (ROCEPHIN) injection 250 mg (250 mg Intramuscular Given 12/15/16 1634)  azithromycin (ZITHROMAX) tablet 1,000 mg (1,000 mg Oral Given 12/15/16 1634)     Initial Impression / Assessment and Plan / ED Course  I have reviewed the triage vital signs and the nursing notes.  Pertinent labs & imaging results that were available during my care of the patient were reviewed by me and considered in my medical decision making (see chart for details).    Patient to be discharged with instructions to follow up with OBGYN. Pt's H&H within normal limits, no signs of anemia. No signs of UTI. Pregnancy is negative. Discussed importance of using protection when sexually active. Pt understands that they have GC/Chlamydia cultures pending and that they will need to inform all sexual partners if results return positive. Pt has been treated prophylacticly with azithromycin and rocephin due to pts history, pelvic exam, and wet prep with increased WBCs. Pt not concerning for PID because hemodynamically stable and no cervical motion tenderness on pelvic exam.  Pt has also been treated with flagyl for Bacterial Vaginosis. Pt has been advised to not drink alcohol while on this medication. Reasons to immediately return to the ED discussed.   Final Clinical Impressions(s) / ED Diagnoses   Final diagnoses:  Possible exposure to STD  Bacterial vaginosis    New Prescriptions New Prescriptions   METRONIDAZOLE (FLAGYL) 500 MG TABLET    Take 1 tablet (500 mg total) by mouth 2 (two) times daily.     [address]Cylis Ayars Manuel [place], [place] 12/15/16 1733    Maia PlanJoshua G Long, MD 12/15/16 2002

## 2016-12-15 NOTE — Discharge Instructions (Signed)
Please schedule appointment with your gynecologist within the week regarding today's visit. Please take metronidazole twice a day for 7 days. It is very important that he take the complete dosage and regimen as prescribed. Keep area clean and drink at least a glass of water throughout the day.  Contact your health care provider right away if: You have severe abdominal pain. You are a man and notice swelling or pain in your testicles. You are a woman and notice swelling or pain in your vagina. Contact a health care provider if: Your symptoms do not improve, even after treatment. You have more discharge or pain when urinating. You have a fever. You have pain in your abdomen. You have pain during sex. You have vaginal bleeding between periods.

## 2016-12-15 NOTE — ED Triage Notes (Signed)
Vaginal bleeding and headache. Dysuria and vaginal odor. States she has had an IUD for the past 4 years.

## 2016-12-15 NOTE — ED Notes (Signed)
Pt reports new sexual partner at home.

## 2016-12-16 LAB — HIV ANTIBODY (ROUTINE TESTING W REFLEX): HIV SCREEN 4TH GENERATION: NONREACTIVE

## 2016-12-16 LAB — RPR: RPR Ser Ql: NONREACTIVE

## 2016-12-18 LAB — GC/CHLAMYDIA PROBE AMP (~~LOC~~) NOT AT ARMC
CHLAMYDIA, DNA PROBE: NEGATIVE
Neisseria Gonorrhea: NEGATIVE

## 2017-03-19 ENCOUNTER — Emergency Department (HOSPITAL_BASED_OUTPATIENT_CLINIC_OR_DEPARTMENT_OTHER)
Admission: EM | Admit: 2017-03-19 | Discharge: 2017-03-19 | Disposition: A | Discharge status: Home / Self Care | Payer: BLUE CROSS/BLUE SHIELD | Attending: Emergency Medicine | Admitting: Emergency Medicine

## 2017-03-19 ENCOUNTER — Encounter (HOSPITAL_BASED_OUTPATIENT_CLINIC_OR_DEPARTMENT_OTHER): Payer: Self-pay

## 2017-03-19 DIAGNOSIS — R1084 Generalized abdominal pain: Secondary | ICD-10-CM | POA: Diagnosis present

## 2017-03-19 DIAGNOSIS — Z5321 Procedure and treatment not carried out due to patient leaving prior to being seen by health care provider: Secondary | ICD-10-CM | POA: Insufficient documentation

## 2017-03-19 DIAGNOSIS — R110 Nausea: Secondary | ICD-10-CM | POA: Insufficient documentation

## 2017-03-19 LAB — URINALYSIS, ROUTINE W REFLEX MICROSCOPIC
Bilirubin Urine: NEGATIVE
Glucose, UA: NEGATIVE mg/dL
Hgb urine dipstick: NEGATIVE
KETONES UR: 15 mg/dL — AB
LEUKOCYTES UA: NEGATIVE
NITRITE: NEGATIVE
PH: 5.5 (ref 5.0–8.0)
PROTEIN: NEGATIVE mg/dL
Specific Gravity, Urine: 1.03 (ref 1.005–1.030)

## 2017-03-19 LAB — PREGNANCY, URINE: PREG TEST UR: NEGATIVE

## 2017-03-19 NOTE — ED Provider Notes (Cosign Needed)
By signing my name below, I, Soijett Blue, attest that this documentation has been prepared under the direction and in the presence of Sharen Hecklaudia Kabeer Hoagland, PA-C Electronically Signed: Soijett Blue, ED Scribe. 03/19/17. 6:14 PM.   6:13 PM- I went to evaluate pt in the room and was informed by Nurse Tech that the pt left the ED after being informed that her pregnancy test was negative.   I personally performed the services described in this documentation, which was scribed in my presence. The recorded information has been reviewed and is accurate.     Liberty HandyGibbons, Tabria Steines J, PA-C 03/20/17 [phone]

## 2017-03-19 NOTE — ED Notes (Signed)
Pt reports she felt like this previously when she was pregnant. Pt requesting blood draw.

## 2017-03-19 NOTE — ED Notes (Signed)
Went to room to see pt but she is not in room.  Corky CraftsGown is on the bed. Cannot find pt in the dept.

## 2017-03-19 NOTE — ED Triage Notes (Signed)
C/o abd cramping, nausea x 1 week-had IUD removed 6/5-started LMP 6/6-NAD-steady gait

## 2017-03-25 ENCOUNTER — Emergency Department (HOSPITAL_BASED_OUTPATIENT_CLINIC_OR_DEPARTMENT_OTHER)
Admission: EM | Admit: 2017-03-25 | Discharge: 2017-03-25 | Disposition: A | Discharge status: Home / Self Care | Payer: BLUE CROSS/BLUE SHIELD | Attending: Emergency Medicine | Admitting: Emergency Medicine

## 2017-03-25 ENCOUNTER — Encounter (HOSPITAL_BASED_OUTPATIENT_CLINIC_OR_DEPARTMENT_OTHER): Payer: Self-pay | Admitting: Emergency Medicine

## 2017-03-25 DIAGNOSIS — N3000 Acute cystitis without hematuria: Secondary | ICD-10-CM | POA: Diagnosis not present

## 2017-03-25 DIAGNOSIS — R300 Dysuria: Secondary | ICD-10-CM

## 2017-03-25 DIAGNOSIS — R109 Unspecified abdominal pain: Secondary | ICD-10-CM | POA: Diagnosis present

## 2017-03-25 LAB — WET PREP, GENITAL
CLUE CELLS WET PREP: NONE SEEN
Sperm: NONE SEEN
Trich, Wet Prep: NONE SEEN
Yeast Wet Prep HPF POC: NONE SEEN

## 2017-03-25 LAB — URINALYSIS, ROUTINE W REFLEX MICROSCOPIC
BILIRUBIN URINE: NEGATIVE
Glucose, UA: NEGATIVE mg/dL
HGB URINE DIPSTICK: NEGATIVE
Ketones, ur: NEGATIVE mg/dL
Nitrite: NEGATIVE
PROTEIN: NEGATIVE mg/dL
SPECIFIC GRAVITY, URINE: 1.024 (ref 1.005–1.030)
pH: 6.5 (ref 5.0–8.0)

## 2017-03-25 LAB — HCG, SERUM, QUALITATIVE: Preg, Serum: NEGATIVE

## 2017-03-25 LAB — URINALYSIS, MICROSCOPIC (REFLEX)

## 2017-03-25 LAB — PREGNANCY, URINE: PREG TEST UR: NEGATIVE

## 2017-03-25 MED ORDER — ONDANSETRON HCL 4 MG/2ML IJ SOLN
4.0000 mg | Freq: Once | INTRAMUSCULAR | Status: AC
  Administered 2017-03-25: 4 mg via INTRAVENOUS
  Filled 2017-03-25: qty 2

## 2017-03-25 MED ORDER — CEPHALEXIN 500 MG PO CAPS: 500.0000 mg | ORAL_CAPSULE | Freq: Two times a day (BID) | ORAL | 0 refills | Status: AC

## 2017-03-25 MED ORDER — SODIUM CHLORIDE 0.9 % IV BOLUS (SEPSIS)
1000.0000 mL | Freq: Once | INTRAVENOUS | Status: AC
  Administered 2017-03-25: 1000 mL via INTRAVENOUS

## 2017-03-25 NOTE — Discharge Instructions (Signed)
Her workup today showed that you are not pregnant. He did however have a urinary tract infection. In the setting of your lower abdominal cramping and the mild burning, we will treat with antibiotics. Please take the antibiotics and stay hydrated. Please follow-up with your PCP for further management. If any symptoms change or worsen, please return to the nearest emergency department.

## 2017-03-25 NOTE — ED Provider Notes (Signed)
[facility] Provider Note   CSN: [id number] Arrival date & time: 03/25/17  4098     History   Chief Complaint Chief Complaint  Patient presents with  . Abdominal Pain    HPI Deandra Goering is a 21 y.o. female.  The history is provided by the patient and medical records. No language interpreter was used.  Vaginal Discharge   This is a recurrent problem. The current episode started more than 2 days ago. The problem occurs constantly. The problem has not changed since onset.The discharge was normal and white. She is not pregnant. She has not missed her period. Associated symptoms include abdominal pain, diarrhea, nausea, vomiting and frequency. Pertinent negatives include no anorexia, no diaphoresis, no fever, no abdominal swelling, no constipation, no dysuria, no perineal pain and no perineal odor. She has tried nothing for the symptoms. The treatment provided no relief.    History reviewed. No pertinent past medical history.  There are no active problems to display for this patient.   History reviewed. No pertinent surgical history.  OB History    No data available       Home Medications    Prior to Admission medications   Medication Sig Start Date End Date Taking? Authorizing Provider  metroNIDAZOLE (FLAGYL) 500 MG tablet Take 1 tablet (500 mg total) by mouth 2 (two) times daily. 12/15/16   Alvina Chou, PA    Family History No family history on file.  Social History Social History  Substance Use Topics  . Smoking status: Never Smoker  . Smokeless tobacco: Never Used  . Alcohol use No     Allergies   Patient has no known allergies.   Review of Systems Review of Systems  Constitutional: Negative for appetite change, chills, diaphoresis, fatigue and fever.  HENT: Negative for congestion.   Respiratory: Negative for apnea and shortness of breath.   Cardiovascular: Negative for chest pain.  Gastrointestinal: Positive for abdominal  pain, diarrhea, nausea and vomiting. Negative for anorexia and constipation.  Genitourinary: Positive for frequency and vaginal discharge. Negative for decreased urine volume, dysuria, flank pain, vaginal bleeding and vaginal pain.  Musculoskeletal: Negative for back pain, neck pain and neck stiffness.  Skin: Negative for wound.  Neurological: Negative for light-headedness.  Psychiatric/Behavioral: Negative for agitation.  All other systems reviewed and are negative.    Physical Exam Updated Vital Signs BP 117/70 (BP Location: Right Arm)   Pulse 94   Temp 98.1 F (36.7 C) (Oral)   Resp 16   Ht 5\' 4"  (1.626 m)   Wt 88.5 kg (195 lb)   LMP 02/28/2017   SpO2 99%   BMI 33.47 kg/m   Physical Exam  Constitutional: She appears well-developed and well-nourished. No distress.  HENT:  Head: Normocephalic and atraumatic.  Nose: Nose normal.  Mouth/Throat: Oropharynx is clear and moist. No oropharyngeal exudate.  Eyes: Conjunctivae are normal. Pupils are equal, round, and reactive to light.  Neck: Neck supple.  Cardiovascular: Normal rate and regular rhythm.   No murmur heard. Pulmonary/Chest: Effort normal and breath sounds normal. No respiratory distress.  Abdominal: Soft. There is no tenderness. There is no guarding.  Genitourinary: Pelvic exam was performed with patient supine. Uterus is not tender. Cervix exhibits discharge. Cervix exhibits no motion tenderness and no friability. Right adnexum displays no tenderness. Left adnexum displays no tenderness. Vaginal discharge found.  Musculoskeletal: She exhibits no edema or tenderness.  Neurological: She is alert. No sensory deficit. She exhibits normal muscle tone.  Skin: Skin is warm and dry. Capillary refill takes less than 2 seconds. No rash noted. No erythema.  Psychiatric: She has a normal mood and affect.  Nursing note and vitals reviewed.    ED Treatments / Results  Labs (all labs ordered are listed, but only abnormal  results are displayed) Labs Reviewed  WET PREP, GENITAL - Abnormal; Notable for the following:       Result Value   WBC, Wet Prep HPF POC MANY (*)    All other components within normal limits  URINALYSIS, ROUTINE W REFLEX MICROSCOPIC - Abnormal; Notable for the following:    APPearance CLOUDY (*)    Leukocytes, UA SMALL (*)    All other components within normal limits  URINALYSIS, MICROSCOPIC (REFLEX) - Abnormal; Notable for the following:    Bacteria, UA FEW (*)    Squamous Epithelial / LPF 0-5 (*)    All other components within normal limits  PREGNANCY, URINE  HCG, SERUM, QUALITATIVE  GC/CHLAMYDIA PROBE AMP ([place]) NOT AT [facility]    EKG  EKG Interpretation None       Radiology No results found.  Procedures Procedures (including critical care time)  Medications Ordered in ED Medications  sodium chloride 0.9 % bolus 1,000 mL (0 mLs Intravenous Stopped 03/25/17 1202)  ondansetron (ZOFRAN) injection 4 mg (4 mg Intravenous Given 03/25/17 1145)     Initial Impression / Assessment and Plan / ED Course  I have reviewed the triage vital signs and the nursing notes.  Pertinent labs & imaging results that were available during my care of the patient were reviewed by me and considered in my medical decision making (see chart for details).     Karle PlumberMarquilla Tarnow is a 21 y.o. female with no significant past medical history who presents with concern for pregnancy, abdominal cramping, urinary frequency, and vaginal discharge. Patient says that over the last several days, she has had some nausea and vomiting which she thinks is due to pregnancy. She says that she is due to have her menstrual cycle "any day" but is concerned she might be pregnant. She reports taking Prevacid test that was negative several days ago but she is not convinced. She says that the cramping is in her lower abdomen. She denies any abdominal trauma. She describes left lower quadrant being the worst area of  discomfort. She does say she had some diarrhea several days ago but that has resolved. She reports urinary frequency but denies any dysuria or hematuria. She denies any fevers, chills, chest pain, shortness of breath, or other symptoms.  History and exam are seen above. On exam, patient had no abdominal tenderness. Lungs were clear. No CVA tenderness. Pelvic exam was performed with some scant discharge. No bleeding. No CMT or adnexal tenderness.  Pertinent tests return negative. Blood print test was also performed and was negative. Patient reassured that she is not pregnant. Patient suspects her lower abdominal cramping is secondary to her menstrual cycle starting. Wet prep showed no evidence of DVT or other abnormalities. There were white blood cells present.  Urinalysis shows leukocytes and bacteria. In the setting of urinary frequency or cramping, patient will be treated for UTI.  Due to lack of CMT or other adnexal or pelvic tenderness, do not feel patient needs empiric treatment for PID at this time. Patient advised that she would be called if GC/chlamydia swabs returned positive. Patient instructed to follow-up with her PCP and OB/GYN for further management.   Patient had no precautions  or concerns and understood return precautions and plan of care. Patient discharged in good condition.     Final Clinical Impressions(s) / ED Diagnoses   Final diagnoses:  Acute cystitis without hematuria  Dysuria    New Prescriptions Discharge Medication List as of 03/25/2017  1:48 PM    START taking these medications   Details  cephALEXin (KEFLEX) 500 MG capsule Take 1 capsule (500 mg total) by mouth 2 (two) times daily., Starting Sun 03/25/2017, Until Sun 04/01/2017, Print        Clinical Impression: 1. Acute cystitis without hematuria   2. Dysuria     Disposition: Discharge  Condition: Good  I have discussed the results, Dx and Tx plan with the pt(& family if present). He/she/they expressed  understanding and agree(s) with the plan. Discharge instructions discussed at great length. Strict return precautions discussed and pt &/or family have verbalized understanding of the instructions. No further questions at time of discharge.    Discharge Medication List as of 03/25/2017  1:48 PM    START taking these medications   Details  cephALEXin (KEFLEX) 500 MG capsule Take 1 capsule (500 mg total) by mouth 2 (two) times daily., Starting Sun 03/25/2017, Until Sun 04/01/2017, Print        Follow Up: Earleen Newport, MD [address] [facility] [place] [address] [phone]  Schedule an appointment as soon as possible for a visit    [facility] [place] [facility]MENT [address] [id number] mc [address] [place] [address] [phone]  If symptoms worsen     Christine Morton, Canary Brim, MD 03/25/17 1726

## 2017-03-25 NOTE — ED Triage Notes (Signed)
States," I feel like I am pregnant and I want a blood test and I am having sharp stomach pains"

## 2017-03-26 LAB — GC/CHLAMYDIA PROBE AMP (~~LOC~~) NOT AT ARMC
CHLAMYDIA, DNA PROBE: NEGATIVE
NEISSERIA GONORRHEA: NEGATIVE

## 2017-06-20 ENCOUNTER — Encounter (HOSPITAL_BASED_OUTPATIENT_CLINIC_OR_DEPARTMENT_OTHER): Payer: Self-pay

## 2017-06-20 ENCOUNTER — Emergency Department (HOSPITAL_BASED_OUTPATIENT_CLINIC_OR_DEPARTMENT_OTHER)
Admission: EM | Admit: 2017-06-20 | Discharge: 2017-06-20 | Disposition: A | Discharge status: Home / Self Care | Payer: BLUE CROSS/BLUE SHIELD | Attending: Emergency Medicine | Admitting: Emergency Medicine

## 2017-06-20 DIAGNOSIS — A499 Bacterial infection, unspecified: Secondary | ICD-10-CM | POA: Insufficient documentation

## 2017-06-20 DIAGNOSIS — N760 Acute vaginitis: Secondary | ICD-10-CM | POA: Insufficient documentation

## 2017-06-20 DIAGNOSIS — B9689 Other specified bacterial agents as the cause of diseases classified elsewhere: Secondary | ICD-10-CM

## 2017-06-20 HISTORY — DX: Other specified bacterial agents as the cause of diseases classified elsewhere: B96.89

## 2017-06-20 HISTORY — DX: Acute vaginitis: N76.0

## 2017-06-20 LAB — URINALYSIS, MICROSCOPIC (REFLEX): RBC / HPF: NONE SEEN RBC/hpf (ref 0–5)

## 2017-06-20 LAB — WET PREP, GENITAL
SPERM: NONE SEEN
Trich, Wet Prep: NONE SEEN
Yeast Wet Prep HPF POC: NONE SEEN

## 2017-06-20 LAB — URINALYSIS, ROUTINE W REFLEX MICROSCOPIC
BILIRUBIN URINE: NEGATIVE
GLUCOSE, UA: NEGATIVE mg/dL
Hgb urine dipstick: NEGATIVE
KETONES UR: NEGATIVE mg/dL
NITRITE: NEGATIVE
PH: 8.5 — AB (ref 5.0–8.0)
PROTEIN: NEGATIVE mg/dL
Specific Gravity, Urine: 1.015 (ref 1.005–1.030)

## 2017-06-20 LAB — PREGNANCY, URINE: Preg Test, Ur: NEGATIVE

## 2017-06-20 MED ORDER — METRONIDAZOLE 500 MG PO TABS: 500.0000 mg | ORAL_TABLET | Freq: Two times a day (BID) | ORAL | 0 refills | Status: AC

## 2017-06-20 MED FILL — metroNIDAZOLE 500 MG TABS: 500 | 7 days supply | Qty: 14 | Fill #0

## 2017-06-20 NOTE — ED Triage Notes (Addendum)
C/o vaginal d/c x 1 week-NAD-steady gait-pt states she has BV often-MD called in flagyl-she completed abx but cont'd foul smelling d/c

## 2017-06-20 NOTE — ED Provider Notes (Signed)
[facility]   CSN: [id number] Arrival date & time: 06/20/17  1153     History   Chief Complaint Chief Complaint  Patient presents with  . Vaginal Discharge    HPI   Blood pressure 99/68, pulse 97, temperature 98.9 F (37.2 C), temperature source Oral, resp. rate 16, height  (1.626 m), weight 86.2 kg (190 lb), last menstrual period 05/26/2017, SpO2 100 %.  Jennifer Paul is a 21 y.o. female complaining of vaginal discharge onset 1 week ago, she took Flagyl for presumed bacterial vaginosis and this was not helpful. She denies rash, lesion, abdominal pain, fever, chills, nausea or vomiting. She feels like she may have a yeast infection. Has not made appointment with OB/GYN.  Past Medical History:  Diagnosis Date  . BV (bacterial vaginosis)     There are no active problems to display for this patient.   History reviewed. No pertinent surgical history.  OB History    No data available       Home Medications    Prior to Admission medications   Medication Sig Start Date End Date Taking? Authorizing Provider  metroNIDAZOLE (FLAGYL) 500 MG tablet Take 1 tablet (500 mg total) by mouth 2 (two) times daily. 06/20/17 06/27/17  Lizett Chowning, Mardella Layman    Family History No family history on file.  Social History Social History  Substance Use Topics  . Smoking status: Never Smoker  . Smokeless tobacco: Never Used  . Alcohol use No     Allergies   Patient has no known allergies.   Review of Systems Review of Systems  A complete review of systems was obtained and all systems are negative except as noted in the HPI and PMH.    Physical Exam Updated Vital Signs BP 116/75 (BP Location: Right Arm)   Pulse 81   Temp 98.7 F (37.1 C) (Oral)   Resp 16   Ht  (1.626 m)   Wt 86.2 kg (190 lb)   LMP 05/26/2017   SpO2 100%   BMI 32.61 kg/m   Physical Exam  Constitutional: She is oriented to person, place, and time. She appears  well-developed and well-nourished. No distress.  HENT:  Head: Normocephalic and atraumatic.  Mouth/Throat: Oropharynx is clear and moist.  Eyes: Pupils are equal, round, and reactive to light. Conjunctivae and EOM are normal.  Neck: Normal range of motion.  Cardiovascular: Normal rate, regular rhythm and intact distal pulses.   Pulmonary/Chest: Effort normal and breath sounds normal.  Abdominal: Soft. There is no tenderness.  Genitourinary:  Genitourinary Comments: Pelvic exam a chaperoned by nurse: No rashes or lesions, moderate amount of yellow, opaque, foul-smelling vaginal discharge with no cervical motion or adnexal tenderness.  Musculoskeletal: Normal range of motion.  Neurological: She is alert and oriented to person, place, and time.  Skin: She is not diaphoretic.  Psychiatric: She has a normal mood and affect.  Nursing note and vitals reviewed.    ED Treatments / Results  Labs (all labs ordered are listed, but only abnormal results are displayed) Labs Reviewed  WET PREP, GENITAL - Abnormal; Notable for the following:       Result Value   Clue Cells Wet Prep HPF POC PRESENT (*)    WBC, Wet Prep HPF POC MODERATE (*)    All other components within normal limits  URINALYSIS, ROUTINE W REFLEX MICROSCOPIC - Abnormal; Notable for the following:    APPearance CLOUDY (*)    pH 8.5 (*)  Leukocytes, UA SMALL (*)    All other components within normal limits  URINALYSIS, MICROSCOPIC (REFLEX) - Abnormal; Notable for the following:    Bacteria, UA MANY (*)    Squamous Epithelial / LPF 6-30 (*)    All other components within normal limits  PREGNANCY, URINE  RPR  HIV ANTIBODY (ROUTINE TESTING)  GC/CHLAMYDIA PROBE AMP ([place]) NOT AT [facility]    EKG  EKG Interpretation None       Radiology No results found.  Procedures Procedures (including critical care time)  Medications Ordered in ED Medications - No data to display   Initial Impression / Assessment and Plan /  ED Course  I have reviewed the triage vital signs and the nursing notes.  Pertinent labs & imaging results that were available during my care of the patient were reviewed by me and considered in my medical decision making (see chart for details).     Vitals:   06/20/17 1202 06/20/17 1410  BP: 99/68 116/75  Pulse: 97 81  Resp: 16 16  Temp: 98.9 F (37.2 C) 98.7 F (37.1 C)  TempSrc: Oral Oral  SpO2: 100% 100%  Weight: 86.2 kg (190 lb)   Height:  (1.626 m)     Jennifer Paul is 21 y.o. female presenting with Vaginal discarge for week not alleviated with Flagyl, no signs of PID. The contaminated urine not consistent with infection. Wet prep with cells, will treat for BV and advise close follow-up with OB/GYN  Evaluation does not show pathology that would require ongoing emergent intervention or inpatient treatment. Pt is hemodynamically stable and mentating appropriately. Discussed findings and plan with patient/guardian, who agrees with care plan. All questions answered. Return precautions discussed and outpatient follow up given.      Final Clinical Impressions(s) / ED Diagnoses   Final diagnoses:  BV (bacterial vaginosis)    New Prescriptions New Prescriptions   METRONIDAZOLE (FLAGYL) 500 MG TABLET    Take 1 tablet (500 mg total) by mouth 2 (two) times daily.     Kaylyn Lim 06/20/17 1423    Vanetta Mulders, MD 06/21/17 [phone]

## 2017-06-20 NOTE — Discharge Instructions (Signed)
Please make an appointment to see your OB/GYN as soon as possible.

## 2017-06-21 LAB — GC/CHLAMYDIA PROBE AMP (~~LOC~~) NOT AT ARMC
Chlamydia: NEGATIVE
Neisseria Gonorrhea: NEGATIVE

## 2017-06-21 LAB — RPR: RPR: NONREACTIVE

## 2017-06-21 LAB — HIV ANTIBODY (ROUTINE TESTING W REFLEX): HIV SCREEN 4TH GENERATION: NONREACTIVE

## 2017-08-11 ENCOUNTER — Emergency Department (HOSPITAL_BASED_OUTPATIENT_CLINIC_OR_DEPARTMENT_OTHER)
Admission: EM | Admit: 2017-08-11 | Discharge: 2017-08-11 | Disposition: A | Discharge status: Home / Self Care | Payer: Self-pay | Attending: Emergency Medicine | Admitting: Emergency Medicine

## 2017-08-11 ENCOUNTER — Encounter (HOSPITAL_BASED_OUTPATIENT_CLINIC_OR_DEPARTMENT_OTHER): Payer: Self-pay | Admitting: Emergency Medicine

## 2017-08-11 ENCOUNTER — Other Ambulatory Visit: Payer: Self-pay

## 2017-08-11 ENCOUNTER — Emergency Department (HOSPITAL_BASED_OUTPATIENT_CLINIC_OR_DEPARTMENT_OTHER): Payer: Self-pay

## 2017-08-11 DIAGNOSIS — M25531 Pain in right wrist: Secondary | ICD-10-CM | POA: Insufficient documentation

## 2017-08-11 MED ORDER — IBUPROFEN 800 MG PO TABS: 800.0000 mg | ORAL_TABLET | Freq: Three times a day (TID) | ORAL | 0 refills | Status: DC

## 2017-08-11 NOTE — ED Triage Notes (Signed)
Pt c/o RT wrist pain since yesterday; no known injury, but thinks she might have caught herself on it.

## 2017-08-11 NOTE — ED Provider Notes (Signed)
[facility] EMERGENCY DEPARTMENT Provider Note   CSN: [id number] Arrival date & time: 08/11/17  1538     History   Chief Complaint Chief Complaint  Patient presents with  . Wrist Pain    HPI Jennifer Paul is a 21 y.o. female with no significant past medical history presenting with right wrist pain for 2 days. Denies any fall, injury or trauma. She states that she may have pushed herself off  Her chair the wrong way.she woke up with the pain yesterday morning. Denies any numbness, except after getting a wrap at work which was too tight. Her pain is aggravated by extension at the wrist. She has not tried anything for the pain.  HPI  Past Medical History:  Diagnosis Date  . BV (bacterial vaginosis)     There are no active problems to display for this patient.   History reviewed. No pertinent surgical history.  OB History    No data available       Home Medications    Prior to Admission medications   Medication Sig Start Date End Date Taking? Authorizing Provider  METRONIDAZOLE PO Take by mouth.   Yes [provider]  ibuprofen (ADVIL,MOTRIN) 800 MG tablet Take 1 tablet (800 mg total) 3 (three) times daily by mouth. 08/11/17   Georgiana ShoreMitchell, Raider Valbuena B, PA-C    Family History History reviewed. No pertinent family history.  Social History Social History   Tobacco Use  . Smoking status: Never Smoker  . Smokeless tobacco: Never Used  Substance Use Topics  . Alcohol use: No  . Drug use: No     Allergies   Patient has no known allergies.   Review of Systems Review of Systems  Constitutional: Negative for chills and fever.  Gastrointestinal: Negative for nausea and vomiting.  Musculoskeletal: Positive for arthralgias. Negative for joint swelling and myalgias.  Skin: Negative for color change, pallor, rash and wound.  Neurological: Negative for weakness and numbness.     Physical Exam Updated Vital Signs BP 114/83 (BP Location: Left  Arm)   Pulse 94   Temp 98.3 F (36.8 C) (Oral)   Resp 20   LMP 07/26/2017   SpO2 98%   Physical Exam  Constitutional: She appears well-developed and well-nourished. No distress.  Afebrile, nontoxic-appearing, sitting comfortably in bed in no acute distress.  HENT:  Head: Normocephalic and atraumatic.  Cardiovascular: Normal rate, regular rhythm, normal heart sounds and intact distal pulses.  No murmur heard. Pulmonary/Chest: Effort normal and breath sounds normal. No stridor. No respiratory distress. She has no wheezes.  Musculoskeletal: Normal range of motion. She exhibits tenderness. She exhibits no edema or deformity.  Full range of motion at the wrist. Tenderness palpation of the dorsal aspect of the right wrist. Strong radial pulses. And station intact neurovascularly intact.  Neurological: She is alert. She exhibits normal muscle tone.  Skin: Skin is warm and dry. Capillary refill takes less than 2 seconds. No rash noted. She is not diaphoretic. No erythema.  Psychiatric: She has a normal mood and affect.  Nursing note and vitals reviewed.    ED Treatments / Results  Labs (all labs ordered are listed, but only abnormal results are displayed) Labs Reviewed - No data to display  EKG  EKG Interpretation None       Radiology Dg Wrist Complete Right  Result Date: 08/11/2017 CLINICAL DATA:  Acute onset right wrist pain times 1-2 days without known injury. EXAM: RIGHT WRIST - COMPLETE 3+ VIEW COMPARISON:  None. FINDINGS: There is no evidence of fracture or dislocation. There is no evidence of arthropathy or other focal bone abnormality. Dedicated view of the scaphoid demonstrates an intact scaphoid. Soft tissues are unremarkable. IMPRESSION: No acute osseous abnormality of the right wrist. Electronically Signed   By: Tollie Ethavid  Kwon M.D.   On: 08/11/2017 16:14    Procedures Procedures (including critical care time) SPLINT APPLICATION Date/Time: 4:52 PM Authorized by:  Georgiana ShoreJessica B Lucas Exline Consent: Verbal consent obtained. Risks and benefits: risks, benefits and alternatives were discussed Consent given by: patient Splint applied by: orthopedic technician Location details: right wrist Splint type: wrist Supplies used: wrist brace Post-procedure: The splinted body part was neurovascularly unchanged following the procedure. Patient tolerance: Patient tolerated the procedure well with no immediate complications.    Medications Ordered in ED Medications - No data to display   Initial Impression / Assessment and Plan / ED Course  I have reviewed the triage vital signs and the nursing notes.  Pertinent labs & imaging results that were available during my care of the patient were reviewed by me and considered in my medical decision making (see chart for details).    Patient presenting with 2 days of right wrist pain, no injury or trauma. Infiltrate negative for acute abnormalities. Neurovascular intact.no erythema, swelling,warmth to touch,fever. She is otherwise well-appearing nontoxic and afebrile.  RICE protocol indicated and discussed with patient. Discharge home with symptomatic relief and close follow-up with PCP as needed. Discussed strict return precautions and advised to return to the emergency department if experiencing any new or worsening symptoms. Instructions were understood and patient agreed with discharge plan.  Final Clinical Impressions(s) / ED Diagnoses   Final diagnoses:  Right wrist pain    ED Discharge Orders        Ordered    ibuprofen (ADVIL,MOTRIN) 800 MG tablet  3 times daily     08/11/17 1646       Gregary CromerMitchell, Jamilya Sarrazin B, PA-C 08/11/17 1653    Arby BarrettePfeiffer, Marcy, MD 08/12/17 1530

## 2017-08-11 NOTE — Discharge Instructions (Signed)
As discussed, follow the RICE protocol outlined these instructions. Follow-up with your primary care provider if symptoms persist beyond a week. Take ibuprofen as needed for pain and swelling. Return sooner if you experience swelling, erythema, warmth to the touch, loss of sensation, fever, chills or other new concerning symptoms in the meantime.

## 2017-09-24 ENCOUNTER — Encounter (HOSPITAL_BASED_OUTPATIENT_CLINIC_OR_DEPARTMENT_OTHER): Payer: Self-pay | Admitting: Emergency Medicine

## 2017-09-24 ENCOUNTER — Emergency Department (HOSPITAL_BASED_OUTPATIENT_CLINIC_OR_DEPARTMENT_OTHER)
Admission: EM | Admit: 2017-09-24 | Discharge: 2017-09-24 | Disposition: A | Discharge status: Home / Self Care | Payer: Self-pay | Attending: Physician Assistant | Admitting: Physician Assistant

## 2017-09-24 ENCOUNTER — Other Ambulatory Visit: Payer: Self-pay

## 2017-09-24 DIAGNOSIS — B9689 Other specified bacterial agents as the cause of diseases classified elsewhere: Secondary | ICD-10-CM

## 2017-09-24 DIAGNOSIS — Z79899 Other long term (current) drug therapy: Secondary | ICD-10-CM | POA: Insufficient documentation

## 2017-09-24 DIAGNOSIS — N760 Acute vaginitis: Secondary | ICD-10-CM | POA: Insufficient documentation

## 2017-09-24 LAB — URINALYSIS, COMPLETE (UACMP) WITH MICROSCOPIC
Bilirubin Urine: NEGATIVE
GLUCOSE, UA: NEGATIVE mg/dL
HGB URINE DIPSTICK: NEGATIVE
Ketones, ur: NEGATIVE mg/dL
Leukocytes, UA: NEGATIVE
NITRITE: NEGATIVE
PROTEIN: NEGATIVE mg/dL
RBC / HPF: NONE SEEN RBC/hpf (ref 0–5)
Specific Gravity, Urine: 1.02 (ref 1.005–1.030)
pH: 6.5 (ref 5.0–8.0)

## 2017-09-24 LAB — WET PREP, GENITAL
Sperm: NONE SEEN
Trich, Wet Prep: NONE SEEN
Yeast Wet Prep HPF POC: NONE SEEN

## 2017-09-24 LAB — PREGNANCY, URINE: Preg Test, Ur: NEGATIVE

## 2017-09-24 MED ORDER — FLUCONAZOLE 100 MG PO TABS
150.0000 mg | ORAL_TABLET | Freq: Once | ORAL | Status: DC
Start: 2017-09-24 — End: 2017-09-24

## 2017-09-24 MED ORDER — FLUCONAZOLE 200 MG PO TABS: 200.0000 mg | ORAL_TABLET | Freq: Once | ORAL | 0 refills | Status: AC

## 2017-09-24 MED ORDER — FLUCONAZOLE 100 MG PO TABS
150.0000 mg | ORAL_TABLET | Freq: Once | ORAL | Status: AC
  Administered 2017-09-24: 150 mg via ORAL
  Filled 2017-09-24: qty 1

## 2017-09-24 MED ORDER — METRONIDAZOLE 500 MG PO TABS: 500.0000 mg | ORAL_TABLET | Freq: Two times a day (BID) | ORAL | 0 refills | Status: DC

## 2017-09-24 MED FILL — metroNIDAZOLE 500 MG TABS: 500 | 7 days supply | Qty: 14 | Fill #0

## 2017-09-24 NOTE — ED Provider Notes (Signed)
[facility] EMERGENCY DEPARTMENT Provider Note   CSN: [id number] Arrival date & time: 09/24/17  1304     History   Chief Complaint Chief Complaint  Patient presents with  . Vaginal Discharge    HPI Jennifer Paul is a 21 y.o. female.  HPI  21 year old female presenting with recurrent BV symptoms.  Patient reports she has had this multiple times in the past.  She reports that she was recently stopped treatment for 2 weeks ago.  Patient recently finished treatment for BV now the symptoms are coming back with bowel odor and mild discharge.  Patient has no urinary tract complaints.  No dyspareunia.  Past Medical History:  Diagnosis Date  . BV (bacterial vaginosis)     There are no active problems to display for this patient.   History reviewed. No pertinent surgical history.  OB History    No data available       Home Medications    Prior to Admission medications   Medication Sig Start Date End Date Taking? Authorizing Provider  ibuprofen (ADVIL,MOTRIN) 800 MG tablet Take 1 tablet (800 mg total) 3 (three) times daily by mouth. 08/11/17   Mathews RobinsonsMitchell, Jessica B, PA-C  metroNIDAZOLE (FLAGYL) 500 MG tablet Take 1 tablet (500 mg total) by mouth 2 (two) times daily. 09/24/17   Kayon Dozier Lyn, MD  METRONIDAZOLE PO Take by mouth.    [provider]    Family History No family history on file.  Social History Social History   Tobacco Use  . Smoking status: Never Smoker  . Smokeless tobacco: Never Used  Substance Use Topics  . Alcohol use: No  . Drug use: No     Allergies   Patient has no known allergies.   Review of Systems Review of Systems  Constitutional: Negative for activity change and fever.  HENT: Negative for sore throat.   Respiratory: Negative for shortness of breath.   Cardiovascular: Negative for chest pain.  Gastrointestinal: Negative for abdominal pain.  Genitourinary: Positive for vaginal discharge.    Neurological: Negative for headaches.     Physical Exam Updated Vital Signs BP 122/78 (BP Location: Right Arm)   Pulse 87   Temp 98.1 F (36.7 C) (Oral)   Resp 14   Ht 5\' 4"  (1.626 m)   Wt 86.2 kg (190 lb)   LMP 08/27/2017   SpO2 100%   BMI 32.61 kg/m   Physical Exam  Constitutional: She is oriented to person, place, and time. She appears well-developed and well-nourished.  HENT:  Head: Normocephalic and atraumatic.  Eyes: Right eye exhibits no discharge. Left eye exhibits no discharge.  Cardiovascular: Normal rate and regular rhythm.  Pulmonary/Chest: Effort normal and breath sounds normal. No respiratory distress.  Genitourinary: Vaginal discharge found.  Genitourinary Comments: No cmt no odor no discharge.   Neurological: She is oriented to person, place, and time.  Skin: Skin is warm and dry. She is not diaphoretic.  Psychiatric: She has a normal mood and affect.  Nursing note and vitals reviewed.    ED Treatments / Results  Labs (all labs ordered are listed, but only abnormal results are displayed) Labs Reviewed  WET PREP, GENITAL - Abnormal; Notable for the following components:      Result Value   Clue Cells Wet Prep HPF POC PRESENT (*)    WBC, Wet Prep HPF POC TOO NUMEROUS TO COUNT (*)    All other components within normal limits  URINALYSIS, COMPLETE (UACMP) WITH MICROSCOPIC -  Abnormal; Notable for the following components:   Squamous Epithelial / LPF 0-5 (*)    Bacteria, UA MANY (*)    All other components within normal limits  PREGNANCY, URINE  GC/CHLAMYDIA PROBE AMP ([place]) NOT [facility]    EKG  EKG Interpretation None       Radiology No results found.  Procedures Procedures (including critical care time)  Medications Ordered in ED Medications  fluconazole (DIFLUCAN) tablet 150 mg (not administered)     Initial Impression / Assessment and Plan / ED Course  I have reviewed the triage vital signs and the nursing  notes.  Pertinent labs & imaging results that were available during my care of the patient were reviewed by me and considered in my medical decision making (see chart for details).      21 year old female presenting with recurrent BV symptoms.  Patient reports she has had this multiple times in the past.  She reports that she was recently stopped treatment for 2 weeks ago.  Patient recently finished treatment for BV now the symptoms are coming back with bowel odor and mild discharge.  Patient has no urinary tract complaints.  No dyspareunia.  5:16 PM  Patient has clue cells on exam with symptoms.  So we will treat again.  I told her that she may require different treatment she will follow-up with her OB/GYN/PCP this week.  Patient reports that she gets yeast infection when she takes methimazole.  So we have provided her with Diflucan Final Clinical Impressions(s) / ED Diagnoses   Final diagnoses:  BV (bacterial vaginosis)    ED Discharge Orders        Ordered    metroNIDAZOLE (FLAGYL) 500 MG tablet  2 times daily     09/24/17 1713       Michaela Broski, Cindee Saltourteney Lyn, MD 09/24/17 1717

## 2017-09-24 NOTE — ED Triage Notes (Signed)
Pt states "I think I have a yeast infection". C/o vaginal discharge. Recently completed abx for BV.

## 2017-09-24 NOTE — Discharge Instructions (Signed)
Please follow-up with your primary or your OB/GYN.  It may be that you need to be on medication to help with your recurrent BV.

## 2017-09-24 NOTE — ED Notes (Signed)
ED Provider at bedside. 

## 2017-09-26 LAB — GC/CHLAMYDIA PROBE AMP (~~LOC~~) NOT AT ARMC
Chlamydia: POSITIVE — AB
NEISSERIA GONORRHEA: NEGATIVE

## 2017-09-27 ENCOUNTER — Telehealth: Payer: Self-pay | Admitting: Student

## 2017-09-27 DIAGNOSIS — A749 Chlamydial infection, unspecified: Secondary | ICD-10-CM

## 2017-09-27 MED ORDER — AZITHROMYCIN 500 MG PO TABS: 1000.0000 mg | ORAL_TABLET | Freq: Once | ORAL | 0 refills | Status: AC

## 2017-09-27 NOTE — Telephone Encounter (Addendum)
Jennifer Paul tested positive for  Chlamydia. Patient was called by RN and allergies and pharmacy confirmed. Rx sent to pharmacy of choice.   Judeth HornLawrence, Sanye Ledesma, NP 09/27/2017 4:14 PM        ----- Message from Kathe BectonLori S Berdik, RN sent at 09/27/2017  4:05 PM EST ----- This patient tested positive for:   Chlamydia  She :"has NKDA", I have informed the patient of her results and confirmed her pharmacy is correct in her chart. Please send Rx.   Thank you,   Kathe BectonBerdik, Lori S, RN   [facility]

## 2018-01-10 ENCOUNTER — Emergency Department (HOSPITAL_COMMUNITY)
Admission: EM | Admit: 2018-01-10 | Discharge: 2018-01-11 | Disposition: A | Discharge status: Home / Self Care | Payer: Medicaid Other | Attending: Emergency Medicine | Admitting: Emergency Medicine

## 2018-01-10 ENCOUNTER — Encounter (HOSPITAL_COMMUNITY): Payer: Self-pay

## 2018-01-10 DIAGNOSIS — N898 Other specified noninflammatory disorders of vagina: Secondary | ICD-10-CM | POA: Insufficient documentation

## 2018-01-10 DIAGNOSIS — Z5321 Procedure and treatment not carried out due to patient leaving prior to being seen by health care provider: Secondary | ICD-10-CM | POA: Insufficient documentation

## 2018-01-10 LAB — PREGNANCY, URINE: PREG TEST UR: NEGATIVE

## 2018-01-10 LAB — URINALYSIS, ROUTINE W REFLEX MICROSCOPIC
BACTERIA UA: NONE SEEN
Bilirubin Urine: NEGATIVE
GLUCOSE, UA: NEGATIVE mg/dL
Hgb urine dipstick: NEGATIVE
Ketones, ur: NEGATIVE mg/dL
Nitrite: NEGATIVE
PROTEIN: NEGATIVE mg/dL
Specific Gravity, Urine: 1.028 (ref 1.005–1.030)
pH: 6 (ref 5.0–8.0)

## 2018-01-10 NOTE — ED Triage Notes (Signed)
Pt presents with white vaginal discharge after taking flagyl for BV.  Pt reports she is treated for BV every month with her period.  Pt reports odor to discharge, denies any abdominal pain.

## 2018-01-15 ENCOUNTER — Other Ambulatory Visit: Payer: Self-pay

## 2018-01-15 ENCOUNTER — Encounter (HOSPITAL_BASED_OUTPATIENT_CLINIC_OR_DEPARTMENT_OTHER): Payer: Self-pay | Admitting: Emergency Medicine

## 2018-01-15 DIAGNOSIS — N898 Other specified noninflammatory disorders of vagina: Secondary | ICD-10-CM | POA: Diagnosis not present

## 2018-01-15 DIAGNOSIS — Z91040 Latex allergy status: Secondary | ICD-10-CM | POA: Insufficient documentation

## 2018-01-15 DIAGNOSIS — R300 Dysuria: Secondary | ICD-10-CM | POA: Diagnosis present

## 2018-01-15 LAB — URINALYSIS, ROUTINE W REFLEX MICROSCOPIC
BILIRUBIN URINE: NEGATIVE
Glucose, UA: NEGATIVE mg/dL
HGB URINE DIPSTICK: NEGATIVE
Ketones, ur: NEGATIVE mg/dL
Leukocytes, UA: NEGATIVE
Nitrite: NEGATIVE
Protein, ur: NEGATIVE mg/dL
SPECIFIC GRAVITY, URINE: 1.025 (ref 1.005–1.030)
pH: 6 (ref 5.0–8.0)

## 2018-01-15 LAB — PREGNANCY, URINE: Preg Test, Ur: NEGATIVE

## 2018-01-15 NOTE — ED Triage Notes (Signed)
Patient states that she is still having urinary irritations and drainage  - was seen and treated about a month ago and then [facility] and was not seen. Patient states that she thinks she has a UTI because she had Burning with Urination but that has resolved now. She states that " I burning down there like a yeast infection"

## 2018-01-16 ENCOUNTER — Emergency Department (HOSPITAL_BASED_OUTPATIENT_CLINIC_OR_DEPARTMENT_OTHER)
Admission: EM | Admit: 2018-01-16 | Discharge: 2018-01-16 | Disposition: A | Discharge status: Home / Self Care | Payer: Medicaid Other | Attending: Emergency Medicine | Admitting: Emergency Medicine

## 2018-01-16 DIAGNOSIS — N898 Other specified noninflammatory disorders of vagina: Secondary | ICD-10-CM

## 2018-01-16 LAB — WET PREP, GENITAL
SPERM: NONE SEEN
TRICH WET PREP: NONE SEEN
YEAST WET PREP: NONE SEEN

## 2018-01-16 LAB — GC/CHLAMYDIA PROBE AMP (~~LOC~~) NOT AT ARMC
CHLAMYDIA, DNA PROBE: NEGATIVE
Neisseria Gonorrhea: NEGATIVE

## 2018-01-16 NOTE — ED Provider Notes (Signed)
[facility] Provider Note: Lowella Dell, MD, FACEP  CSN: [id number] MRN: [id number] ARRIVAL: 01/15/18 at 2208 ROOM: MH07/[facility]07   CHIEF COMPLAINT  Dysuria   HISTORY OF PRESENT ILLNESS  01/16/18 1:15 AM Jennifer Paul is a 22 y.o. female who was treated last week for bacterial vaginosis and vaginal yeast infection.  She was given Flagyl and Diflucan.  She took 2 doses of Diflucan and finished the Flagyl 5 days ago.  Despite treatment she had persistent dysuria and white vaginal discharge.  The dysuria has now abated but she is still having the discharge.  She denies any burning or discomfort presently.   Past Medical History:  Diagnosis Date  . BV (bacterial vaginosis)     History reviewed. No pertinent surgical history.  History reviewed. No pertinent family history.  Social History   Tobacco Use  . Smoking status: Never Smoker  . Smokeless tobacco: Never Used  Substance Use Topics  . Alcohol use: No  . Drug use: No    Prior to Admission medications   Medication Sig Start Date End Date Taking? Authorizing Provider  ibuprofen (ADVIL,MOTRIN) 800 MG tablet Take 1 tablet (800 mg total) 3 (three) times daily by mouth. 08/11/17   Mathews Robinsons B, PA-C  metroNIDAZOLE (FLAGYL) 500 MG tablet Take 1 tablet (500 mg total) by mouth 2 (two) times daily. 09/24/17   Mackuen, Courteney Lyn, MD  METRONIDAZOLE PO Take by mouth.    [provider]    Allergies Latex   REVIEW OF SYSTEMS  Negative except as noted here or in the History of Present Illness.   PHYSICAL EXAMINATION  Initial Vital Signs Blood pressure 110/72, pulse 92, temperature 98.4 F (36.9 C), temperature source Oral, resp. rate 18, height 5\' 4"  (1.626 m), weight 86.2 kg (190 lb), last menstrual period 01/02/2018, SpO2 100 %.  Examination General: Well-developed, well-nourished female in no acute distress; appearance consistent with age of record HENT: normocephalic; atraumatic Eyes:  pupils equal, round and reactive to light; extraocular muscles intact Neck: supple Heart: regular rate and rhythm Lungs: clear to auscultation bilaterally Abdomen: soft; nondistended; nontender; no masses or hepatosplenomegaly; bowel sounds present GU: Normal external genitalia; white vaginal discharge; no vaginal bleeding; no cervical motion tenderness; no adnexal tenderness Extremities: No deformity; full range of motion; pulses normal Neurologic: Awake, alert and oriented; motor function intact in all extremities and symmetric; no facial droop Skin: Warm and dry Psychiatric: Normal mood and affect   RESULTS  Summary of this visit's results, reviewed by myself:   EKG Interpretation  Date/Time:    Ventricular Rate:    PR Interval:    QRS Duration:   QT Interval:    QTC Calculation:   R Axis:     Text Interpretation:        Laboratory Studies: Results for orders placed or performed during the hospital encounter of 01/16/18 (from the past 24 hour(s))  Urinalysis, Routine w reflex microscopic     Status: None   Collection Time: 01/15/18 10:27 PM  Result Value Ref Range   Color, Urine YELLOW YELLOW   APPearance CLEAR CLEAR   Specific Gravity, Urine 1.025 1.005 - 1.030   pH 6.0 5.0 - 8.0   Glucose, UA NEGATIVE NEGATIVE mg/dL   Hgb urine dipstick NEGATIVE NEGATIVE   Bilirubin Urine NEGATIVE NEGATIVE   Ketones, ur NEGATIVE NEGATIVE mg/dL   Protein, ur NEGATIVE NEGATIVE mg/dL   Nitrite NEGATIVE NEGATIVE   Leukocytes, UA NEGATIVE NEGATIVE  Pregnancy, urine  Status: None   Collection Time: 01/15/18 10:27 PM  Result Value Ref Range   Preg Test, Ur NEGATIVE NEGATIVE  Wet prep, genital     Status: Abnormal   Collection Time: 01/16/18  1:25 AM  Result Value Ref Range   Yeast Wet Prep HPF POC NONE SEEN NONE SEEN   Trich, Wet Prep NONE SEEN NONE SEEN   Clue Cells Wet Prep HPF POC PRESENT (A) NONE SEEN   WBC, Wet Prep HPF POC MANY (A) NONE SEEN   Sperm NONE SEEN    Imaging  Studies: No results found.  ED COURSE and MDM  Nursing notes and initial vitals signs, including pulse oximetry, reviewed.  Vitals:   01/15/18 2213 01/15/18 2219 01/16/18 0151  BP: 110/72  109/73  Pulse: 92  83  Resp: 18  18  Temp: 98.4 F (36.9 C)    TempSrc: Oral    SpO2: 100%  100%  Weight: 86.2 kg (190 lb) 86.2 kg (190 lb)   Height: 5\' 4"  (1.626 m) 5\' 4"  (1.626 m)    The patient had an unremarkable exam and her dysuria has subsided.  She has no evidence of urinary tract infection and her wet prep does not show yeast.  Even though she clue cells are positive she was recently treated for BV and I do not believe she needs to be retreated at this time.  PROCEDURES    ED DIAGNOSES     ICD-10-CM   1. White vaginal discharge N89.8        Paula LibraMolpus, Alekhya Gravlin, MD 01/16/18 [phone]

## 2018-01-22 ENCOUNTER — Encounter (HOSPITAL_BASED_OUTPATIENT_CLINIC_OR_DEPARTMENT_OTHER): Payer: Self-pay

## 2018-01-22 ENCOUNTER — Other Ambulatory Visit: Payer: Self-pay

## 2018-01-22 ENCOUNTER — Emergency Department (HOSPITAL_BASED_OUTPATIENT_CLINIC_OR_DEPARTMENT_OTHER)
Admission: EM | Admit: 2018-01-22 | Discharge: 2018-01-23 | Disposition: A | Discharge status: Home / Self Care | Payer: Medicaid Other | Attending: Emergency Medicine | Admitting: Emergency Medicine

## 2018-01-22 DIAGNOSIS — Z91040 Latex allergy status: Secondary | ICD-10-CM | POA: Diagnosis not present

## 2018-01-22 DIAGNOSIS — N760 Acute vaginitis: Secondary | ICD-10-CM | POA: Diagnosis not present

## 2018-01-22 DIAGNOSIS — Z79899 Other long term (current) drug therapy: Secondary | ICD-10-CM | POA: Insufficient documentation

## 2018-01-22 DIAGNOSIS — N898 Other specified noninflammatory disorders of vagina: Secondary | ICD-10-CM | POA: Diagnosis present

## 2018-01-22 DIAGNOSIS — B9689 Other specified bacterial agents as the cause of diseases classified elsewhere: Secondary | ICD-10-CM

## 2018-01-22 LAB — URINALYSIS, MICROSCOPIC (REFLEX)

## 2018-01-22 LAB — URINALYSIS, ROUTINE W REFLEX MICROSCOPIC
Bilirubin Urine: NEGATIVE
Glucose, UA: NEGATIVE mg/dL
KETONES UR: 15 mg/dL — AB
NITRITE: NEGATIVE
PH: 6.5 (ref 5.0–8.0)
Protein, ur: NEGATIVE mg/dL
SPECIFIC GRAVITY, URINE: 1.025 (ref 1.005–1.030)

## 2018-01-22 LAB — PREGNANCY, URINE: Preg Test, Ur: NEGATIVE

## 2018-01-22 NOTE — ED Triage Notes (Signed)
C/o vaginal d/c and dysuria x 2 weeks-states she was seen for same with negative results-NAD-steady gait

## 2018-01-23 ENCOUNTER — Encounter (HOSPITAL_BASED_OUTPATIENT_CLINIC_OR_DEPARTMENT_OTHER): Payer: Self-pay | Admitting: Emergency Medicine

## 2018-01-23 LAB — WET PREP, GENITAL
Sperm: NONE SEEN
Trich, Wet Prep: NONE SEEN
Yeast Wet Prep HPF POC: NONE SEEN

## 2018-01-23 MED ORDER — METRONIDAZOLE 500 MG PO TABS: 500.0000 mg | ORAL_TABLET | Freq: Two times a day (BID) | ORAL | 0 refills | Status: DC

## 2018-01-23 NOTE — ED Provider Notes (Signed)
[facility] EMERGENCY DEPARTMENT Provider Note   CSN: [id number] Arrival date & time: 01/22/18  2112     History   Chief Complaint Chief Complaint  Patient presents with  . Vaginal Discharge    HPI Jennifer Paul is a 22 y.o. female.  The history is provided by the patient. No language interpreter was used.  Vaginal Discharge   This is a chronic problem. The current episode started more than 1 week ago. The problem occurs constantly. The problem has not changed since onset.The discharge occurs spontaneously. The discharge was white. She is not pregnant. She has not missed her period. Pertinent negatives include no anorexia, no fever, no abdominal pain, no constipation, no dyspareunia, no genital itching and no genital lesions.  Seen for same multiple times.  Has not followed up with GYN.  Sx persist.    Past Medical History:  Diagnosis Date  . BV (bacterial vaginosis)     There are no active problems to display for this patient.   History reviewed. No pertinent surgical history.   OB History   None      Home Medications    Prior to Admission medications   Medication Sig Start Date End Date Taking? Authorizing Provider  cetirizine (ZYRTEC) 10 MG tablet Take 10 mg by mouth daily.   Yes [provider]  ibuprofen (ADVIL,MOTRIN) 800 MG tablet Take 1 tablet (800 mg total) 3 (three) times daily by mouth. 08/11/17   Mathews Robinsons B, PA-C  metroNIDAZOLE (FLAGYL) 500 MG tablet Take 1 tablet (500 mg total) by mouth 2 (two) times daily. One po bid x 7 days 01/23/18   Nicanor Alcon, Kery Haltiwanger, MD    Family History No family history on file.  Social History Social History   Tobacco Use  . Smoking status: Never Smoker  . Smokeless tobacco: Never Used  Substance Use Topics  . Alcohol use: No  . Drug use: No     Allergies   Latex   Review of Systems Review of Systems  Constitutional: Negative for fever.  HENT: Negative for facial swelling.   Eyes:  Negative for photophobia.  Gastrointestinal: Negative for abdominal pain, anorexia and constipation.  Genitourinary: Positive for vaginal discharge. Negative for decreased urine volume, dyspareunia, menstrual problem and pelvic pain.  All other systems reviewed and are negative.    Physical Exam Updated Vital Signs BP 119/85 (BP Location: Right Arm)   Pulse 93   Temp 98.6 F (37 C) (Oral)   Resp 16   Ht  (1.626 m)   Wt 86.2 kg (190 lb)   LMP 01/02/2018 (Exact Date)   SpO2 100%   BMI 32.61 kg/m   Physical Exam  Constitutional: She is oriented to person, place, and time. She appears well-developed and well-nourished. No distress.  HENT:  Head: Normocephalic and atraumatic.  Mouth/Throat: No oropharyngeal exudate.  Eyes: Pupils are equal, round, and reactive to light. Conjunctivae are normal.  Neck: Normal range of motion. Neck supple.  Cardiovascular: Normal rate, regular rhythm, normal heart sounds and intact distal pulses.  Pulmonary/Chest: Effort normal and breath sounds normal. No stridor. She has no wheezes. She has no rales.  Abdominal: Soft. Bowel sounds are normal. She exhibits no mass. There is no tenderness. There is no rebound and no guarding.  Genitourinary: Vaginal discharge found.  Genitourinary Comments: Chaperone present no cmt or adnexal tenderness  Musculoskeletal: Normal range of motion.  Neurological: She is alert and oriented to person, place, and time. She displays  normal reflexes.  Skin: Skin is warm and dry. Capillary refill takes less than 2 seconds.  Psychiatric: She has a normal mood and affect.     ED Treatments / Results  Labs (all labs ordered are listed, but only abnormal results are displayed) Results for orders placed or performed during the hospital encounter of 01/22/18  Wet prep, genital  Result Value Ref Range   Yeast Wet Prep HPF POC NONE SEEN NONE SEEN   Trich, Wet Prep NONE SEEN NONE SEEN   Clue Cells Wet Prep HPF POC PRESENT  (A) NONE SEEN   WBC, Wet Prep HPF POC MANY (A) NONE SEEN   Sperm NONE SEEN   Urinalysis, Routine w reflex microscopic  Result Value Ref Range   Color, Urine YELLOW YELLOW   APPearance HAZY (A) CLEAR   Specific Gravity, Urine 1.025 1.005 - 1.030   pH 6.5 5.0 - 8.0   Glucose, UA NEGATIVE NEGATIVE mg/dL   Hgb urine dipstick TRACE (A) NEGATIVE   Bilirubin Urine NEGATIVE NEGATIVE   Ketones, ur 15 (A) NEGATIVE mg/dL   Protein, ur NEGATIVE NEGATIVE mg/dL   Nitrite NEGATIVE NEGATIVE   Leukocytes, UA TRACE (A) NEGATIVE  Pregnancy, urine  Result Value Ref Range   Preg Test, Ur NEGATIVE NEGATIVE  Urinalysis, Microscopic (reflex)  Result Value Ref Range   RBC / HPF 0-5 0 - 5 RBC/hpf   WBC, UA 0-5 0 - 5 WBC/hpf   Bacteria, UA RARE (A) NONE SEEN   Squamous Epithelial / LPF 6-10 0 - 5   Mucus PRESENT    No results found.  EKG None  Radiology No results found.  Procedures Procedures (including critical care time)    Final Clinical Impressions(s) / ED Diagnoses   Final diagnoses:  BV (bacterial vaginosis)   Follow up with your GYN, patient verbalizes understanding and agrees to follow up. Will treat as patient has ongoing symptoms.     Return for weakness, numbness, changes in vision or speech, fevers >100.4 unrelieved by medication, shortness of breath, intractable vomiting, or diarrhea, abdominal pain, Inability to tolerate liquids or food, cough, altered mental status or any concerns. No signs of systemic illness or infection. The patient is nontoxic-appearing on exam and vital signs are within normal limits.   I have reviewed the triage vital signs and the nursing notes. Pertinent labs &imaging results that were available during my care of the patient were reviewed by me and considered in my medical decision making (see chart for details).  After history, exam, and medical workup I feel the patient has been appropriately medically screened and is safe for discharge  home. Pertinent diagnoses were discussed with the patient. Patient was given return precautions.  ED Discharge Orders        Ordered    metroNIDAZOLE (FLAGYL) 500 MG tablet  2 times daily     01/23/18 0126       Aliya Sol, MD 01/23/18 [phone]

## 2018-01-23 NOTE — ED Notes (Signed)
Pt verbalizes understanding of d/c instructions and denies any further needs at this time. 

## 2018-01-24 LAB — GC/CHLAMYDIA PROBE AMP (~~LOC~~) NOT AT ARMC
Chlamydia: NEGATIVE
Neisseria Gonorrhea: NEGATIVE

## 2018-02-03 ENCOUNTER — Emergency Department (HOSPITAL_COMMUNITY)
Admission: EM | Admit: 2018-02-03 | Discharge: 2018-02-03 | Disposition: A | Discharge status: Home / Self Care | Payer: Medicaid Other | Attending: Emergency Medicine | Admitting: Emergency Medicine

## 2018-02-03 ENCOUNTER — Other Ambulatory Visit: Payer: Self-pay

## 2018-02-03 ENCOUNTER — Encounter (HOSPITAL_COMMUNITY): Payer: Self-pay

## 2018-02-03 DIAGNOSIS — R112 Nausea with vomiting, unspecified: Secondary | ICD-10-CM | POA: Diagnosis not present

## 2018-02-03 DIAGNOSIS — Z5321 Procedure and treatment not carried out due to patient leaving prior to being seen by health care provider: Secondary | ICD-10-CM | POA: Insufficient documentation

## 2018-02-03 DIAGNOSIS — R1030 Lower abdominal pain, unspecified: Secondary | ICD-10-CM | POA: Diagnosis present

## 2018-02-03 LAB — URINALYSIS, ROUTINE W REFLEX MICROSCOPIC
BILIRUBIN URINE: NEGATIVE
GLUCOSE, UA: NEGATIVE mg/dL
HGB URINE DIPSTICK: NEGATIVE
Ketones, ur: 5 mg/dL — AB
Leukocytes, UA: NEGATIVE
Nitrite: NEGATIVE
PROTEIN: NEGATIVE mg/dL
Specific Gravity, Urine: 1.026 (ref 1.005–1.030)
pH: 6 (ref 5.0–8.0)

## 2018-02-03 NOTE — ED Notes (Signed)
Attempted to stick patient for blood work. Unsuccessful x1. This RN offered an ultrasound IV. She stated that she was trying to find a ride home and felt a lot better. She declined another stick.

## 2018-02-03 NOTE — ED Triage Notes (Addendum)
Per EMS, Pt, from home, c/o lower abdominal pain radiating into lower back and nausea x .  Pain score 10/10.  Pt has not taken anything for pain.  LMP 4/5.  Sts she recently took a pregnancy test which was negative.  Sts "when I was pregnant before, the urine tests were negative."  Pt was seen at [facility] on 4/30 and diagnosed w/ BV.  Reports those symptoms have resolved.

## 2018-02-03 NOTE — ED Notes (Signed)
No answer when called for a room. 

## 2018-02-10 ENCOUNTER — Other Ambulatory Visit: Payer: Self-pay

## 2018-02-10 ENCOUNTER — Emergency Department (HOSPITAL_BASED_OUTPATIENT_CLINIC_OR_DEPARTMENT_OTHER)
Admission: EM | Admit: 2018-02-10 | Discharge: 2018-02-10 | Disposition: A | Discharge status: Home / Self Care | Payer: Medicaid Other | Attending: Emergency Medicine | Admitting: Emergency Medicine

## 2018-02-10 ENCOUNTER — Encounter (HOSPITAL_BASED_OUTPATIENT_CLINIC_OR_DEPARTMENT_OTHER): Payer: Self-pay | Admitting: Emergency Medicine

## 2018-02-10 DIAGNOSIS — N898 Other specified noninflammatory disorders of vagina: Secondary | ICD-10-CM | POA: Diagnosis present

## 2018-02-10 DIAGNOSIS — Z3202 Encounter for pregnancy test, result negative: Secondary | ICD-10-CM | POA: Diagnosis not present

## 2018-02-10 DIAGNOSIS — Z79899 Other long term (current) drug therapy: Secondary | ICD-10-CM | POA: Diagnosis not present

## 2018-02-10 DIAGNOSIS — B9689 Other specified bacterial agents as the cause of diseases classified elsewhere: Secondary | ICD-10-CM | POA: Insufficient documentation

## 2018-02-10 DIAGNOSIS — N760 Acute vaginitis: Secondary | ICD-10-CM | POA: Diagnosis not present

## 2018-02-10 DIAGNOSIS — Z91040 Latex allergy status: Secondary | ICD-10-CM | POA: Diagnosis not present

## 2018-02-10 LAB — URINALYSIS, MICROSCOPIC (REFLEX): RBC / HPF: NONE SEEN RBC/hpf (ref 0–5)

## 2018-02-10 LAB — WET PREP, GENITAL
Sperm: NONE SEEN
TRICH WET PREP: NONE SEEN
Yeast Wet Prep HPF POC: NONE SEEN

## 2018-02-10 LAB — URINALYSIS, ROUTINE W REFLEX MICROSCOPIC
BILIRUBIN URINE: NEGATIVE
Glucose, UA: NEGATIVE mg/dL
Hgb urine dipstick: NEGATIVE
KETONES UR: NEGATIVE mg/dL
NITRITE: NEGATIVE
PH: 6 (ref 5.0–8.0)
Protein, ur: NEGATIVE mg/dL
SPECIFIC GRAVITY, URINE: 1.025 (ref 1.005–1.030)

## 2018-02-10 LAB — HCG, SERUM, QUALITATIVE: PREG SERUM: NEGATIVE

## 2018-02-10 LAB — PREGNANCY, URINE: Preg Test, Ur: NEGATIVE

## 2018-02-10 MED ORDER — METRONIDAZOLE 500 MG PO TABS: 500.0000 mg | ORAL_TABLET | Freq: Two times a day (BID) | ORAL | 0 refills | Status: DC

## 2018-02-10 NOTE — ED Notes (Signed)
Pt given d/c instructions as per chart. Rx x 1. Verbalizes understanding. No questions. 

## 2018-02-10 NOTE — ED Notes (Signed)
Patient did a pregnancy test 4-5 days ago, negative.  She stated that she came here to have a blood work done to confirm if she is pregnant.  She also stated that her pH is off.  Denies pain.

## 2018-02-10 NOTE — ED Triage Notes (Signed)
Pt sts her period is 2 wks late; also reports foul smelling vaginal dc

## 2018-02-10 NOTE — Discharge Instructions (Signed)
See your gynecologist for recheck.  Return if any problems.

## 2018-02-11 NOTE — ED Provider Notes (Signed)
[facility] EMERGENCY DEPARTMENT Provider Note   CSN: [id number] Arrival date & time: 02/10/18  1934     History   Chief Complaint Chief Complaint  Patient presents with  . Possible Pregnancy    HPI Jennifer Paul is a 22 y.o. female.  The history is provided by the patient. No language interpreter was used.  Possible Pregnancy  This is a new problem. The current episode started more than 2 days ago. The problem occurs constantly. The problem has not changed since onset.Nothing aggravates the symptoms. Nothing relieves the symptoms. She has tried nothing for the symptoms. The treatment provided no relief.  Pt reports she wants a blood pregnancy test.  Pt thinks urine test are wrong.  Pt also complains of vaginal discharge.  Pt has frequent BV.   Past Medical History:  Diagnosis Date  . BV (bacterial vaginosis)     There are no active problems to display for this patient.   History reviewed. No pertinent surgical history.   OB History   None      Home Medications    Prior to Admission medications   Medication Sig Start Date End Date Taking? Authorizing Provider  cetirizine (ZYRTEC) 10 MG tablet Take 10 mg by mouth daily.    [provider]  ibuprofen (ADVIL,MOTRIN) 800 MG tablet Take 1 tablet (800 mg total) 3 (three) times daily by mouth. 08/11/17   Mathews Robinsons B, PA-C  metroNIDAZOLE (FLAGYL) 500 MG tablet Take 1 tablet (500 mg total) by mouth 2 (two) times daily. 02/10/18   Elson Areas, PA-C    Family History No family history on file.  Social History Social History   Tobacco Use  . Smoking status: Never Smoker  . Smokeless tobacco: Never Used  Substance Use Topics  . Alcohol use: No  . Drug use: No     Allergies   Latex   Review of Systems Review of Systems  All other systems reviewed and are negative.    Physical Exam Updated Vital Signs BP 113/70 (BP Location: Right Arm)   Pulse 98   Temp 98.4 F (36.9 C)  (Oral)   Resp 17   Ht  (1.626 m)   Wt 87.1 kg (192 lb)   LMP 12/30/2017   SpO2 100%   BMI 32.96 kg/m   Physical Exam  Constitutional: She appears well-developed and well-nourished.  HENT:  Head: Normocephalic and atraumatic.  Right Ear: External ear normal.  Left Ear: External ear normal.  Eyes: Pupils are equal, round, and reactive to light. Conjunctivae are normal.  Neck: Normal range of motion. Neck supple.  Cardiovascular: Normal rate.  Pulmonary/Chest: Effort normal.  Abdominal: Soft. There is no tenderness.  Genitourinary: Vagina normal.  Genitourinary Comments: Vaginal discharge,  Thick white,  Adnexa no masses,  Cervix nontender  Musculoskeletal: Normal range of motion.  Skin: Skin is warm.     ED Treatments / Results  Labs (all labs ordered are listed, but only abnormal results are displayed) Labs Reviewed  WET PREP, GENITAL - Abnormal; Notable for the following components:      Result Value   Clue Cells Wet Prep HPF POC PRESENT (*)    WBC, Wet Prep HPF POC MANY (*)    All other components within normal limits  URINALYSIS, ROUTINE W REFLEX MICROSCOPIC - Abnormal; Notable for the following components:   Leukocytes, UA TRACE (*)    All other components within normal limits  URINALYSIS, MICROSCOPIC (REFLEX) - Abnormal; Notable  for the following components:   Bacteria, UA MANY (*)    All other components within normal limits  PREGNANCY, URINE  HCG, SERUM, QUALITATIVE  GC/CHLAMYDIA PROBE AMP ([place]) NOT AT [facility]    EKG None  Radiology No results found.  Procedures Procedures (including critical care time)  Medications Ordered in ED Medications - No data to display   Initial Impression / Assessment and Plan / ED Course  I have reviewed the triage vital signs and the nursing notes.  Pertinent labs & imaging results that were available during my care of the patient were reviewed by me and considered in my medical decision making (see chart for  details).     Urine and serum pregnancy negative.  Pt advised to follow up with Dr. Ferdinand Cava for recheck   Final Clinical Impressions(s) / ED Diagnoses   Final diagnoses:  BV (bacterial vaginosis)    ED Discharge Orders        Ordered    metroNIDAZOLE (FLAGYL) 500 MG tablet  2 times daily     02/10/18 2304    An After Visit Summary was printed and given to the patient.    Elson Areas, [place] 02/11/18 1536    Pricilla Loveless, MD 02/12/18 [phone]

## 2018-02-12 LAB — GC/CHLAMYDIA PROBE AMP (~~LOC~~) NOT AT ARMC
CHLAMYDIA, DNA PROBE: NEGATIVE
NEISSERIA GONORRHEA: NEGATIVE

## 2019-07-12 IMAGING — CR DG WRIST COMPLETE 3+V*R*
4 series · 4 of 4 positions shown · non-contrast
Comparison: None.

CLINICAL DATA: Acute onset right wrist pain times 1-2 days without
known injury.

EXAM:
RIGHT WRIST - COMPLETE 3+ VIEW

[x wrist pa right]
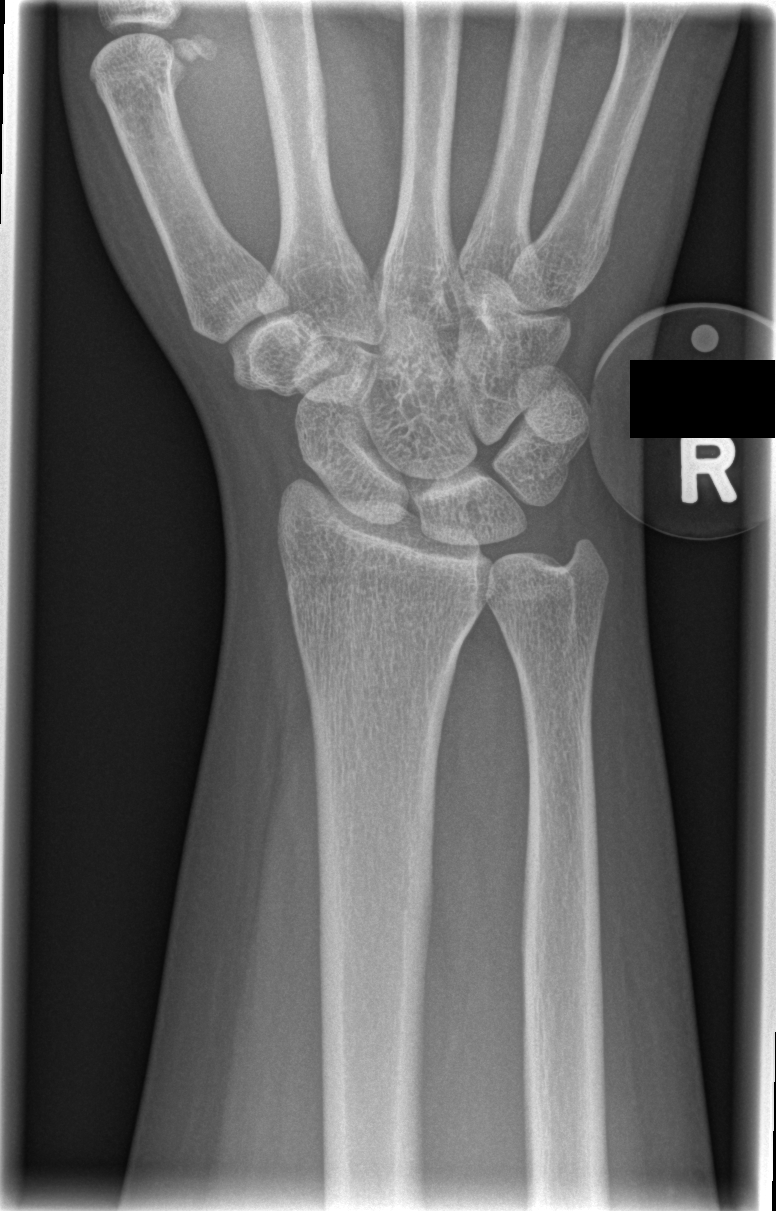

[x wrist obl right]
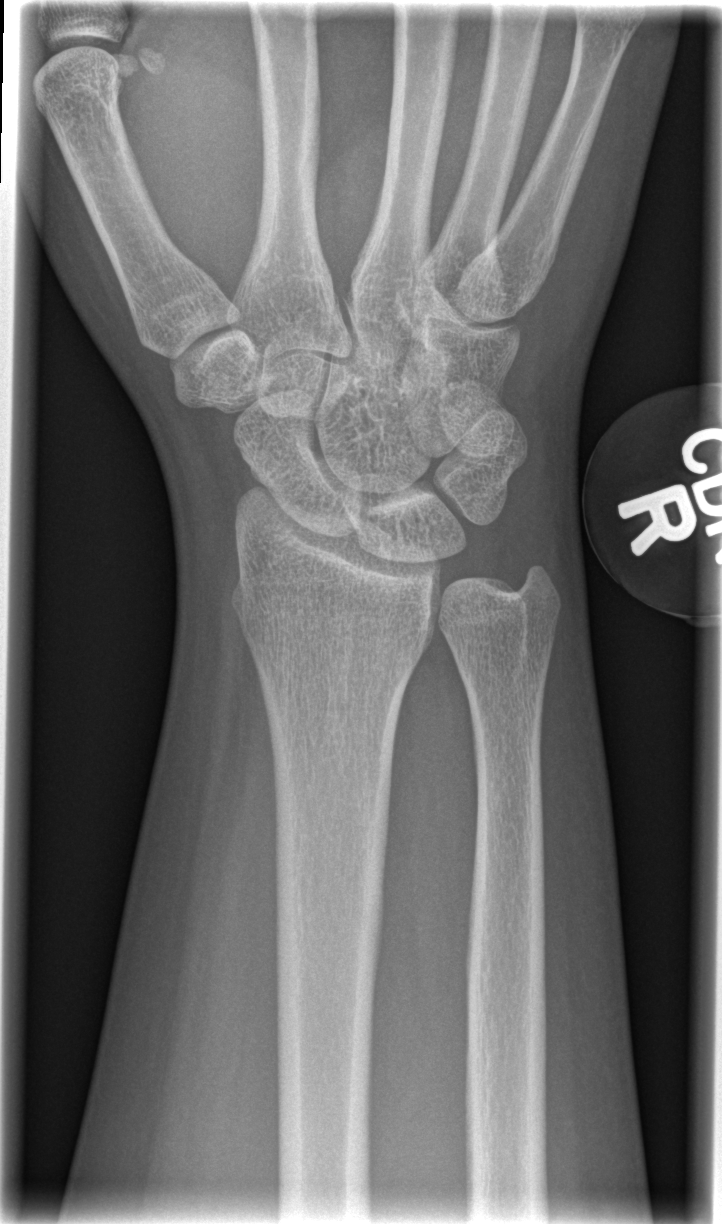

[x wrist lat right]
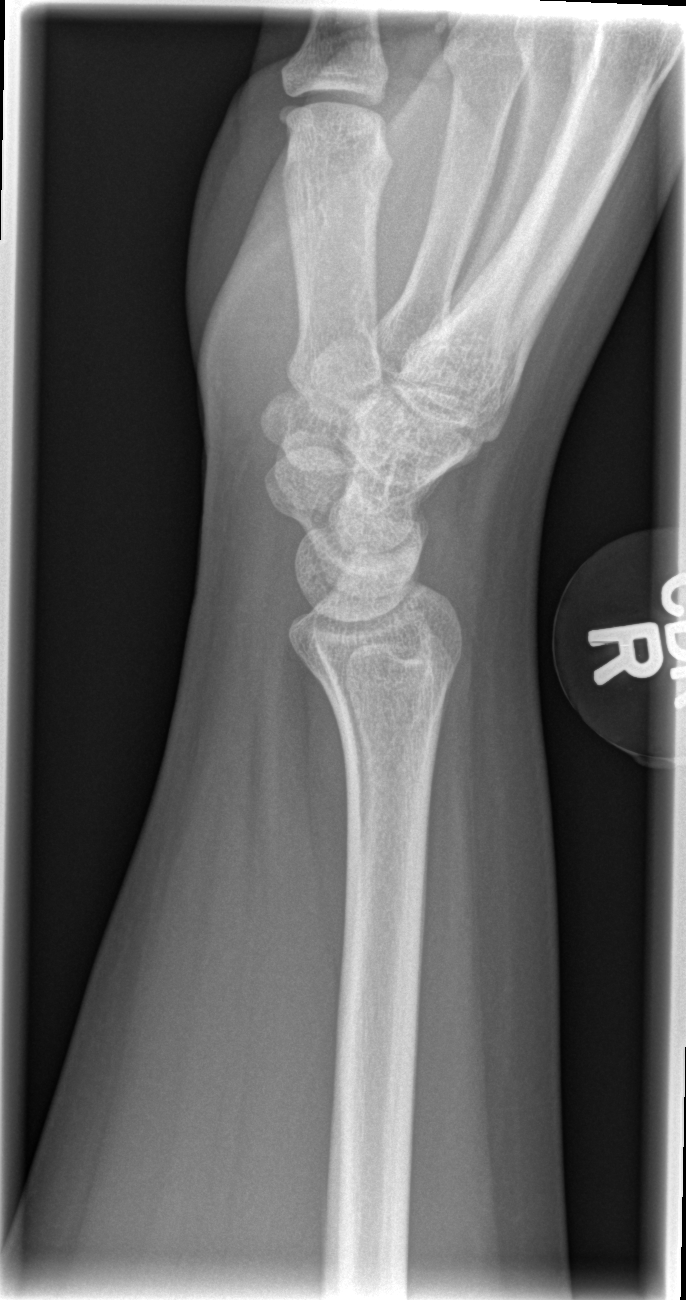

[x navicular]
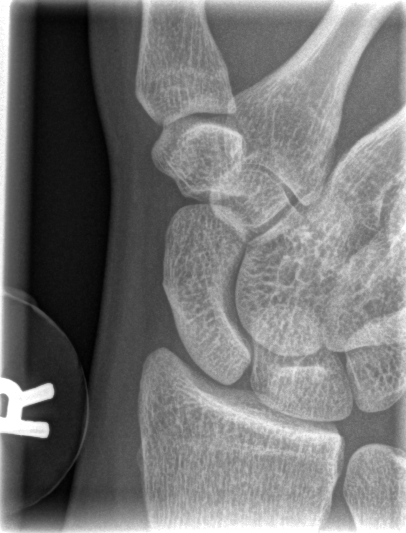

[4 of 4 positions shown; findings below may reference images not displayed]

FINDINGS: There is no evidence of fracture or dislocation. There is no
evidence of arthropathy or other focal bone abnormality. Dedicated
view of the scaphoid demonstrates an intact scaphoid. Soft tissues
are unremarkable.
IMPRESSION: No acute osseous abnormality of the right wrist.

## 2019-11-03 ENCOUNTER — Ambulatory Visit: Payer: Medicaid Other | Attending: Internal Medicine

## 2019-11-24 ENCOUNTER — Ambulatory Visit: Payer: Medicaid Other | Attending: Internal Medicine

## 2019-12-31 ENCOUNTER — Other Ambulatory Visit: Payer: Self-pay

## 2019-12-31 ENCOUNTER — Encounter: Payer: Self-pay | Admitting: Neurology

## 2019-12-31 ENCOUNTER — Telehealth: Payer: Self-pay | Admitting: Neurology

## 2019-12-31 ENCOUNTER — Ambulatory Visit (INDEPENDENT_AMBULATORY_CARE_PROVIDER_SITE_OTHER): Payer: Medicaid Other | Admitting: Neurology

## 2019-12-31 VITALS — BP 110/78 | HR 101 | Temp 98.0°F | Ht 64.0 in | Wt 215.2 lb

## 2019-12-31 DIAGNOSIS — R9082 White matter disease, unspecified: Secondary | ICD-10-CM | POA: Insufficient documentation

## 2019-12-31 DIAGNOSIS — R200 Anesthesia of skin: Secondary | ICD-10-CM

## 2019-12-31 DIAGNOSIS — F418 Other specified anxiety disorders: Secondary | ICD-10-CM

## 2019-12-31 NOTE — Progress Notes (Signed)
[facility]  PATIENT: Nyrie Sigal DOB: [birth year]  REFERRING DOCTOR OR PCP:  Dr. Ferdinand Cava SOURCE: Patient, notes from [facility], imaging and lab reports, MRI images personally reviewed.  _________________________________   HISTORICAL  CHIEF COMPLAINT:  Chief Complaint  Patient presents with  . New Patient (Initial Visit)    RM 13, alone. Paper referral from Earleen Newport, MD for 2nd opinion on MS. She also has migraines.     HISTORY OF PRESENT ILLNESS:  I had the pleasure seeing your patient, Julann Mcgilvray, at the MS center at [facility] for neurologic consultation regarding her numbness and possibility of MS.  She is a 24 year old woman who had the onset of headaches when about 6 months pregnant.  The night before her visit with Dr. Alton Revere, she had right sided facial and tongue numbness.   Symptoms were only one night and there was no other numbness or weakness.   An MRI was performed showing multiple small T2/FLAIR hyperintense foci in the periventricular white matter.     She delivered her son 09/09/2019.   She had pre-eclampsia post partum with elevated BP.    She had an LP 10/2019 and CSF did not show elevated Kappa free light chains.    She has not had any new neurologic symptoms.    She has had left thumb pain and was told she had a tendinopathy in the left hand.     She denies other episodes of neurologic symptoms lasting over an hour.    She has no FH of MS or other autoimmune disorder.    Her MGM has seizures.      I personally reviewed the MRI of the brain dated 06/15/2019.  It shows multiple small T2/FLAIR hyperintense foci, predominantly in the periventricular white matter.  The study was done without contrast.  2 of the foci were hyperintense on diffusion-weighted images.  The MRI is consistent with MS though not diagnostic by itself.  I reviewed laboratory data.   Lumbar puncture was performed 10/31/2019.  Glucose,  protein, cells were normal.  VDRL and Kappa free light chain was negative (so oligoclonal bands was not performed).  ANA was negative.  REVIEW OF SYSTEMS: Constitutional: No fevers, chills, sweats, or change in appetite Eyes: No visual changes, double vision, eye pain Ear, nose and throat: No hearing loss, ear pain, nasal congestion, sore throat Cardiovascular: No chest pain, palpitations Respiratory: No shortness of breath at rest or with exertion.   No wheezes GastrointestinaI: No nausea, vomiting, diarrhea, abdominal pain, fecal incontinence Genitourinary: No dysuria, urinary retention or frequency.  No nocturia. Musculoskeletal: No neck pain, back pain Integumentary: No rash, pruritus, skin lesions Neurological: as above Psychiatric: No depression at this time.  No anxiety Endocrine: No palpitations, diaphoresis, change in appetite, change in weigh or increased thirst Hematologic/Lymphatic: No anemia, purpura, petechiae. Allergic/Immunologic: No itchy/runny eyes, nasal congestion, recent allergic reactions, rashes  ALLERGIES: Allergies  Allergen Reactions  . Latex Itching    HOME MEDICATIONS:  Current Outpatient Medications:  .  cetirizine (ZYRTEC) 10 MG tablet, Take 10 mg by mouth daily., Disp: , Rfl:   PAST MEDICAL HISTORY: Past Medical History:  Diagnosis Date  . BV (bacterial vaginosis)     PAST SURGICAL HISTORY: No past surgical history on file.  FAMILY HISTORY: History reviewed. No pertinent family history.  SOCIAL HISTORY:  Social History   Socioeconomic History  . Marital status: Single    Spouse name: Not on file  .  Number of children: 2  . Years of education: 55  . Highest education level: Not on file  Occupational History  . Occupation: [employer] nurse  Tobacco Use  . Smoking status: Never Smoker  . Smokeless tobacco: Never Used  Substance and Sexual Activity  . Alcohol use: No  . Drug use: No  . Sexual activity: Yes    Birth  control/protection: None  Other Topics Concern  . Not on file  Social History Narrative      Caffeine use: iced coffee daily   Right handed    Social Determinants of Health   Financial Resource Strain:   . Difficulty of Paying Living Expenses:   Food Insecurity:   . Worried About Charity fundraiser in the Last Year:   . Arboriculturist in the Last Year:   Transportation Needs:   . Film/video editor (Medical):   Marland Kitchen Lack of Transportation (Non-Medical):   Physical Activity:   . Days of Exercise per Week:   . Minutes of Exercise per Session:   Stress:   . Feeling of Stress :   Social Connections:   . Frequency of Communication with Friends and Family:   . Frequency of Social Gatherings with Friends and Family:   . Attends Religious Services:   . Active Member of Clubs or Organizations:   . Attends Archivist Meetings:   Marland Kitchen Marital Status:   Intimate Partner Violence:   . Fear of Current or Ex-Partner:   . Emotionally Abused:   Marland Kitchen Physically Abused:   . Sexually Abused:      PHYSICAL EXAM  Vitals:   12/31/19 1334  BP: 110/78  Pulse: (!) 101  Temp: 98 F (36.7 C)  Weight: 215 lb 3.2 oz (97.6 kg)  Height: 5\' 4"  (1.626 m)    Body mass index is 36.94 kg/m.   General: The patient is well-developed and well-nourished and in no acute distress  HEENT:  Head is [place]/AT.  Sclera are anicteric.  Funduscopic exam shows normal optic discs and retinal vessels.  Neck: No carotid bruits are noted.  The neck is nontender.  Cardiovascular: The heart has a regular rate and rhythm with a normal S1 and S2. There were no murmurs, gallops or rubs.    Skin: Extremities are without rash or  edema.  Musculoskeletal:  Back is nontender  Neurologic Exam  Mental status: The patient is alert and oriented x 3 at the time of the examination. The patient has apparent normal recent and remote memory, with an apparently normal attention span and concentration ability.   Speech  is normal.  Cranial nerves: Extraocular movements are full. Pupils are equal, round, and reactive to light and accomodation.  Visual fields are full.  Facial symmetry is present. There is good facial sensation to soft touch bilaterally.Facial strength is normal.  Trapezius and sternocleidomastoid strength is normal. No dysarthria is noted.  The tongue is midline, and the patient has symmetric elevation of the soft palate. No obvious hearing deficits are noted.  Motor:  Muscle bulk is normal.   Tone is normal. Strength is  5 / 5 in all 4 extremities.   Sensory: Sensory testing is intact to pinprick, soft touch and vibration sensation in all 4 extremities.  Coordination: Cerebellar testing reveals good finger-nose-finger and heel-to-shin bilaterally.  Gait and station: Station is normal.   Gait is normal. Tandem gait is normal. Romberg is negative.   Reflexes: Deep tendon reflexes are  symmetric and normal bilaterally.   Plantar responses are flexor.    DIAGNOSTIC DATA (LABS, IMAGING, TESTING) - I reviewed patient records, labs, notes, testing and imaging myself where available.  Lab Results  Component Value Date   WBC 7.4 07/31/2015   HGB 12.0 12/15/2016   HCT 37.2 12/15/2016   MCV 83.8 07/31/2015   PLT 312 07/31/2015      Component Value Date/Time   NA 138 07/31/2015 1745   K 3.8 07/31/2015 1745   CL 104 07/31/2015 1745   CO2 25 07/31/2015 1745   GLUCOSE 81 07/31/2015 1745   BUN 13 07/31/2015 1745   CREATININE 0.68 07/31/2015 1745   CALCIUM 8.9 07/31/2015 1745   GFRNONAA >60 07/31/2015 1745   GFRAA >60 07/31/2015 1745   No results found for: CHOL, HDL, LDLCALC, LDLDIRECT, TRIG, CHOLHDL No results found for: ZRAQ7M No results found for: VITAMINB12 No results found for: TSH     ASSESSMENT AND PLAN  White matter abnormality on MRI of brain - Plan: MR BRAIN W WO CONTRAST, CANCELED: MR BRAIN W WO CONTRAST  Right facial numbness - Plan: MR BRAIN W WO CONTRAST, CANCELED:  MR BRAIN W WO CONTRAST  Depression with anxiety   In summary, Ms. Bonsignore is a 24 year old woman with an abnormal MRI of the brain worrisome for multiple sclerosis who also had a 1 day episode of right facial numbness.  Cerebrospinal fluid was normal.  We had a long discussion about the significance of her MRI.  I still think that there is a possibility that she has MS but the possibility is probably closer to 5050 now that the spinal fluid is negative.  We will recheck an MRI of the brain and compare side-by-side with the previous 1 to determine if there are additional lesions.  If present, the possibility of MS is higher and we will consider a disease modifying therapy.  If negative, we will still need to consider a follow-up MRI again in about 1 year.  If she does that length of time without changes on MRI, then MS becomes less likely.  She will return to see me in 6 months or sooner for new or worsening neurologic symptoms.  Thank you for asking me to see Ms. Milbourne.  Please let me know if I can be of further assistance with her patients in the future.   Morena Mckissack A. Epimenio Foot, MD, [facility] 12/31/2019, 1:45 PM Certified in Neurology, Clinical Neurophysiology, Sleep Medicine and Neuroimaging  [facility] [address] [place], [place] [address] [phone]

## 2019-12-31 NOTE — Telephone Encounter (Signed)
medicaid order sent to [facility]. They will obtain the auth and reach out to the patient to schedule.  °

## 2020-03-16 ENCOUNTER — Telehealth: Payer: Self-pay | Admitting: *Deleted

## 2020-03-16 NOTE — Telephone Encounter (Signed)
Called patient back. I reviewed her chart. She was calling about getting MRI scheduled. States she never heard from anyone. Advised it looks like [facility] tried twice to reach her. She will call them back at [phone] to schedule. She will call our office back if she runs into any issues getting scheduled.

## 2020-03-16 NOTE — Telephone Encounter (Signed)
Patient called and LVM at 9:25 AM asking for call back to get some information on her last office visit. She said the doctor said she would receive a call to schedule an appointment. Call back # [phone].   *I am checking VMs for the clinic. I see she has an appt already scheduled for October.

## 2020-04-07 NOTE — Telephone Encounter (Signed)
Patient has new Radio broadcast assistant community.Berkley Harvey: [id number] (exp. 04/07/20 to 05/22/20) patient is scheduled at [facility] for 04/08/20.

## 2020-04-08 ENCOUNTER — Ambulatory Visit
Admission: RE | Admit: 2020-04-08 | Discharge: 2020-04-08 | Disposition: A | Discharge status: Home / Self Care | Payer: Medicaid Other | Source: Ambulatory Visit | Attending: Neurology | Admitting: Neurology

## 2020-04-08 ENCOUNTER — Other Ambulatory Visit: Payer: Self-pay

## 2020-04-08 ENCOUNTER — Other Ambulatory Visit: Payer: Self-pay | Admitting: Neurology

## 2020-04-08 DIAGNOSIS — R9082 White matter disease, unspecified: Secondary | ICD-10-CM

## 2020-04-08 DIAGNOSIS — R200 Anesthesia of skin: Secondary | ICD-10-CM

## 2020-04-09 ENCOUNTER — Ambulatory Visit
Admission: RE | Admit: 2020-04-09 | Discharge: 2020-04-09 | Disposition: A | Discharge status: Home / Self Care | Payer: Medicaid Other | Source: Ambulatory Visit | Attending: Neurology | Admitting: Neurology

## 2020-04-09 DIAGNOSIS — R9082 White matter disease, unspecified: Secondary | ICD-10-CM

## 2020-04-09 DIAGNOSIS — R200 Anesthesia of skin: Secondary | ICD-10-CM

## 2020-04-09 MED ORDER — GADOBENATE DIMEGLUMINE 529 MG/ML IV SOLN
20.0000 mL | Freq: Once | INTRAVENOUS | Status: AC | PRN
  Administered 2020-04-09: 20 mL via INTRAVENOUS

## 2020-04-13 ENCOUNTER — Telehealth: Payer: Self-pay | Admitting: Neurology

## 2020-04-13 NOTE — Telephone Encounter (Signed)
Jennifer Paul,Jennifer Paul(mother on DPR) is asking for a call with the results to the MRI with contrast.  Please call

## 2020-04-13 NOTE — Telephone Encounter (Signed)
Called mother back and relayed results note sent to pt via mychart to mother: "He reviewed MRI of the brain and compared it to her previous one. It shows the old spots that were on her  previous MRI but there are no new ones. Because there is still some chance of MS, I will want to check 1 more MRI in about a year". She verbalized understanding and appreciation for call.

## 2020-07-01 ENCOUNTER — Ambulatory Visit: Payer: Medicaid Other | Admitting: Neurology

## 2020-10-07 ENCOUNTER — Encounter: Payer: Self-pay | Admitting: Neurology

## 2020-10-07 ENCOUNTER — Ambulatory Visit: Payer: Medicaid Other | Admitting: Neurology

## 2020-10-07 VITALS — BP 115/75 | HR 90 | Ht 64.0 in | Wt 222.0 lb

## 2020-10-07 DIAGNOSIS — R200 Anesthesia of skin: Secondary | ICD-10-CM

## 2020-10-07 DIAGNOSIS — E559 Vitamin D deficiency, unspecified: Secondary | ICD-10-CM | POA: Diagnosis not present

## 2020-10-07 DIAGNOSIS — R9082 White matter disease, unspecified: Secondary | ICD-10-CM | POA: Diagnosis not present

## 2020-10-07 NOTE — Progress Notes (Signed)
[facility]  PATIENT: Jennifer Paul DOB: Aug 06, 1996  REFERRING DOCTOR OR PCP:  Dr. Ferdinand Cava SOURCE: Patient, notes from [facility], imaging and lab reports, MRI images personally reviewed.  _________________________________   HISTORICAL  CHIEF COMPLAINT:  Chief Complaint  Patient presents with  . Follow-up    RM 12. Last seen 12/31/19. Doing well, denies any new sx.    HISTORY OF PRESENT ILLNESS:  Jennifer Paul is a 25 y.o. woman with numbness abnormal MRI.  Update 10/07/2020: Since her last visit, she has had MRI 04/09/2020.  It showed the periventricular foci including radially oriented foci.  There were no new lesions compared to the 06/15/2019 MRI.    She has not had any new neurologic symptoms.   She denies any problems with gait or balance.   She has no problems going up or down stairs.   She denies bladder changes.   Vision is fine.   Color vision is symmetric.     She has no FH of MS or other autoimmune disorder.    Her MGM has seizures.      Headaches are doing well.  Abnormal MRI history: In 2019, while pregnant, she was experiencing headaches.  She had an episode of right-sided facial and tongue numbness and sore Dr. Alton Revere.  This episode lasted 1 day.  An MRI was performed showing multiple small T2/FLAIR hyperintense foci in the periventricular white matter.     She delivered her son 09/09/2019.   She had pre-eclampsia post partum with elevated BP.    She had an LP 10/2019 and CSF did not show elevated Kappa free light chains.   The MRI was repeated 04/09/2020 and showed no new lesions.  Imaging: MRI of the brain dated 06/15/2019 shows multiple small T2/FLAIR hyperintense foci, predominantly in the periventricular white matter.  The study was done without contrast.  2 of the foci were hyperintense on diffusion-weighted images.  The MRI is consistent with MS though not diagnostic by itself.  MRI of the brain 04/09/2020 shows T2/FLAIR  hyperintense foci, many in the periventricular white matter, some radially oriented to the ventricles.  None of the foci appear to be acute and they do not enhance.  Compared to the MRI dated 06/15/2019, there are no definite new lesions.  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   Laboratory data.    Lumbar puncture was performed 10/31/2019.  Glucose, protein, cells were normal.  VDRL and Kappa free light chain was negative (so oligoclonal bands was not performed).  ANA was negative.  REVIEW OF SYSTEMS: Constitutional: No fevers, chills, sweats, or change in appetite Eyes: No visual changes, double vision, eye pain Ear, nose and throat: No hearing loss, ear pain, nasal congestion, sore throat Cardiovascular: No chest pain, palpitations Respiratory: No shortness of breath at rest or with exertion.   No wheezes GastrointestinaI: No nausea, vomiting, diarrhea, abdominal pain, fecal incontinence Genitourinary: No dysuria, urinary retention or frequency.  No nocturia. Musculoskeletal: No neck pain, back pain Integumentary: No rash, pruritus, skin lesions Neurological: as above Psychiatric: No depression at this time.  No anxiety Endocrine: No palpitations, diaphoresis, change in appetite, change in weigh or increased thirst Hematologic/Lymphatic: No anemia, purpura, petechiae. Allergic/Immunologic: No itchy/runny eyes, nasal congestion, recent allergic reactions, rashes  ALLERGIES: Allergies  Allergen Reactions  . Latex Itching    HOME MEDICATIONS:  Current  Outpatient Medications:  .  fluconazole (DIFLUCAN) 150 MG tablet, Take 150 mg by mouth once., Disp: , Rfl:   PAST MEDICAL HISTO Past Medical History:  Diagnosis Date  . BV (bacterial vaginosis)     PAST SURGICAL HISTORY: History reviewed. No pertinent surgical history.  FAMILY  HISTORY: History reviewed. No pertinent family history.  SOCIAL HISTORY:  Social History   Socioeconomic History  . Marital status: Single    Spouse name: Not on file  . Number of children: 2  . Years of education: 9  . Highest education level: Not on file  Occupational History  . Occupation: [employer] nurse  Tobacco Use  . Smoking status: Never Smoker  . Smokeless tobacco: Never Used  Vaping Use  . Vaping Use: Never used  Substance and Sexual Activity  . Alcohol use: No  . Drug use: No  . Sexual activity: Yes    Birth control/protection: None  Other Topics Concern  . Not on file  Social History Narrative      Caffeine use: iced coffee daily   Right handed    Social Determinants of Health   Financial Resource Strain: Not on file  Food Insecurity: Not on file  Transportation Needs: Not on file  Physical Activity: Not on file  Stress: Not on file  Social Connections: Not on file  Intimate Partner Violence: Not on file     PHYSICAL EXAM  Vitals:   10/07/20 1244  BP: 115/75  Pulse: 90  Weight: 222 lb (100.7 kg)  Height: 5\' 4"  (1.626 m)    Body mass index is 38.11 kg/m.   General: The patient is well-developed and well-nourished and in no acute distress  HEENT:  Head is [place]/AT.  Sclera are anicteric.    Skin: Extremities are without rash or  edema.  \Neurologic Exam  Mental status: The patient is alert and oriented x 3 at the time of the examination. The patient has apparent normal recent and remote memory, with an apparently normal attention span and concentration ability.   Speech is normal.  Cranial nerves: Extraocular movements are full.  Color vision was normal and symmetric.  Facial strength and sensation was normal.  No obvious hearing deficits are noted.  Motor:  Muscle bulk is normal.   Tone is normal. Strength is  5 / 5 in all 4 extremities.   Sensory: Sensory testing is intact to pinprick, soft touch and vibration sensation in  all 4 extremities.  Coordination: Cerebellar testing reveals good finger-nose-finger and heel-to-shin bilaterally.  Gait and station: Station is normal.  Gait is normal.  Tandem gait was normal.  Romberg was negative. Reflexes: Deep tendon reflexes are symmetric and normal bilaterally.   Plantar responses are flexor.    DIAGNOSTIC DATA (LABS, IMAGING, TESTING) - I reviewed patient records, labs, notes, testing and imaging myself where available.  Lab Results  Component Value Date   WBC 7.4 07/31/2015   HGB 12.0 12/15/2016   HCT 37.2 12/15/2016   MCV 83.8 07/31/2015   PLT 312 07/31/2015      Component Value Date/Time   NA 138 07/31/2015 1745   K 3.8 07/31/2015 1745   CL 104 07/31/2015 1745   CO2 25 07/31/2015 1745   GLUCOSE 81 07/31/2015 1745   BUN 13 07/31/2015 1745   CREATININE 0.68 07/31/2015 1745   CALCIUM 8.9 07/31/2015 1745   GFRNONAA >60 07/31/2015 1745   GFRAA >60 07/31/2015 1745       ASSESSMENT AND PLAN  Right facial numbness - Plan: MR BRAIN W WO CONTRAST, CANCELED: MR BRAIN W WO CONTRAST  White matter abnormality on MRI of brain - Plan: MR BRAIN W WO CONTRAST, CANCELED: MR BRAIN W WO CONTRAST  Vitamin D deficiency - Plan: VITAMIN D 25 Hydroxy (Vit-D Deficiency, Fractures)  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  MRI was stable.  We discussed obtaining 1 more MRI around July 2022.  If new lesions are noted, the likelihood of MS becomes higher and we would need to consider a disease modifying therapy.  However, if no new foci noted, the likelihood of MS is even smaller 2.   Vitamin D was low.  This will be supplemented. 3.   Return in 1 year or sooner if there are new or worsening neurologic symptoms  Elleanor Guyett A. Epimenio Foot, MD, [facility] 10/07/2020, 1:13 PM Certified in Neurology, Clinical Neurophysiology, Sleep Medicine and Neuroimaging  [facility] [address] [place], [place] [address] [phone]

## 2020-10-08 ENCOUNTER — Other Ambulatory Visit: Payer: Self-pay | Admitting: Neurology

## 2020-10-08 LAB — VITAMIN D 25 HYDROXY (VIT D DEFICIENCY, FRACTURES): Vit D, 25-Hydroxy: 16.6 ng/mL — ABNORMAL LOW (ref 30.0–100.0)

## 2020-10-08 MED ORDER — VITAMIN D (ERGOCALCIFEROL) 1.25 MG (50000 UNIT) PO CAPS: 50000.0000 [IU] | ORAL_CAPSULE | ORAL | 1 refills | Status: DC

## 2020-10-13 ENCOUNTER — Telehealth: Payer: Self-pay | Admitting: Neurology

## 2020-10-13 NOTE — Telephone Encounter (Signed)
Dr. Sater- what would you recommend? 

## 2020-10-13 NOTE — Telephone Encounter (Signed)
Pt states she has been taking her Vitamin D, Ergocalciferol, (DRISDOL) 1.25 MG (50000 UNIT) CAPS capsule wrong, she has been taking it everyday and not just for the 7  days as prescribed, please call.

## 2020-10-13 NOTE — Telephone Encounter (Signed)
Called pt back. Relayed Dr. Bonnita Hollow recommendation. She verbalized understanding and appreciation.

## 2020-10-13 NOTE — Telephone Encounter (Signed)
Okay she can wait about 2 weeks and then start taking it 1 pill a week

## 2021-04-13 ENCOUNTER — Ambulatory Visit
Admission: RE | Admit: 2021-04-13 | Discharge: 2021-04-13 | Disposition: A | Discharge status: Home / Self Care | Payer: Medicaid Other | Source: Ambulatory Visit | Attending: Neurology | Admitting: Neurology

## 2021-04-13 DIAGNOSIS — R9082 White matter disease, unspecified: Secondary | ICD-10-CM

## 2021-04-13 DIAGNOSIS — R200 Anesthesia of skin: Secondary | ICD-10-CM | POA: Diagnosis not present

## 2021-04-13 MED ORDER — GADOBENATE DIMEGLUMINE 529 MG/ML IV SOLN
20.0000 mL | Freq: Once | INTRAVENOUS | Status: AC | PRN
  Administered 2021-04-13: 20 mL via INTRAVENOUS

## 2021-05-10 ENCOUNTER — Encounter: Payer: Self-pay | Admitting: Neurology

## 2021-05-10 ENCOUNTER — Ambulatory Visit: Payer: Medicaid Other | Admitting: Neurology

## 2021-05-10 ENCOUNTER — Other Ambulatory Visit: Payer: Self-pay

## 2021-05-10 VITALS — BP 116/79 | HR 94 | Ht 64.0 in | Wt 219.0 lb

## 2021-05-10 DIAGNOSIS — R930 Abnormal findings on diagnostic imaging of skull and head, not elsewhere classified: Secondary | ICD-10-CM | POA: Diagnosis not present

## 2021-05-10 DIAGNOSIS — R200 Anesthesia of skin: Secondary | ICD-10-CM | POA: Diagnosis not present

## 2021-05-10 DIAGNOSIS — E559 Vitamin D deficiency, unspecified: Secondary | ICD-10-CM

## 2021-05-10 DIAGNOSIS — R9082 White matter disease, unspecified: Secondary | ICD-10-CM

## 2021-05-10 NOTE — Telephone Encounter (Signed)
Called the patient back and didn't see where there was discussion on oral vitamin d dosing. Pt states that she was told around 6 months after she should switch to a daily supplement. Pt had several other questions in regards to plan of care and after discussing felt it was best the patient come in and be seen. Dr Epimenio Foot had a cancellation today and I was able to work her in. Pt also felt may need to have labs rechecked. She was appreciative for the call back.

## 2021-05-10 NOTE — Progress Notes (Signed)
[facility]  PATIENT: Jennifer Paul DOB: January 15, 1996  REFERRING DOCTOR OR PCP:  Dr. Ferdinand Cava SOURCE: Patient, notes from [facility], imaging and lab reports, MRI images personally reviewed.  _________________________________   HISTORICAL  CHIEF COMPLAINT:  Chief Complaint  Patient presents with   Follow-up    RM 1 w/ mother. Last seen 10/07/20. Wants to discuss plan moving forward since MRI ok. Still has ongoing sx. Also wants to know what OTC Vit D she should take.    HISTORY OF PRESENT ILLNESS:  Jennifer Paul is a 25 y.o. woman with numbness abnormal MRI.  Update 05/10/2021: She had a repeat MRi last month.   It did not show any new lesions compared to the MRI from 2021 2020.  The MRI show periventricular foci including radially oriented foci.    The symptoms that led to her initial MRI was numbness in the face that lasted less than 24 hours during her pregnancy.  We discussed that that would not be a typical MS symptoms due to the short duration.  She has not had any new neurologic symptoms.   She denies any problems with gait or balance.   She has no problems going up or down stairs.   She denies bladder changes.   Vision is fine.   Color vision is symmetric.     She has no FH of MS or other autoimmune disorder.    Her MGM has seizures.      Headaches are doing well.    She had head trauma at age 52.  There was no LOC.   She as referred to Neurology and had a CT scan her mom believes.     Abnormal MRI history: In 2019, while pregnant, she was experiencing headaches.  She had an episode of right-sided facial and tongue numbness and sore Dr. Alton Revere.  This episode lasted 1 day.  An MRI was performed showing multiple small T2/FLAIR hyperintense foci in the periventricular white matter.     She delivered her son 09/09/2019.   She had pre-eclampsia post partum with elevated BP.    She had an LP 10/2019 and CSF did not show elevated Kappa free light  chains.   The MRI was repeated 04/09/2020 and showed no new lesions.  Imaging: MRI of the brain dated 06/15/2019 shows multiple small T2/FLAIR hyperintense foci, predominantly in the periventricular white matter.  The study was done without contrast.  2 of the foci were hyperintense on diffusion-weighted images.  The MRI is consistent with MS though not diagnostic by itself.  MRI of the brain 04/09/2020 shows T2/FLAIR hyperintense foci, many in the periventricular white matter, some radially oriented to the ventricles.  None of the foci appear to be acute and they do not enhance.  Compared to the MRI dated 06/15/2019, there are no definite new lesions.  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   MRI of the brain 04/13/2021 showed no new lesions compared to 04/09/2020 and 06/15/2019  She finished the 6 months of vitamin D supplements and is now taking OTC supplements.  Laboratory data.    Lumbar puncture was performed 10/31/2019.  Glucose, protein, cells were normal.  VDRL and Kappa free light chain was negative (oligoclonal bands was not performed).  ANA was negative.  2021, vitamin D was low (16.6 when she was placed on supplements  REVIEW  OF SYSTEMS: Constitutional: No fevers, chills, sweats, or change in appetite Eyes: No visual changes, double vision, eye pain Ear, nose and throat: No hearing loss, ear pain, nasal congestion, sore throat Cardiovascular: No chest pain, palpitations Respiratory:  No shortness of breath at rest or with exertion.   No wheezes GastrointestinaI: No nausea, vomiting, diarrhea, abdominal pain, fecal incontinence Genitourinary:  No dysuria, urinary retention or frequency.  No nocturia. Musculoskeletal:  No neck pain, back pain Integumentary: No rash, pruritus, skin lesions Neurological: as above Psychiatric: No depression  at this time.  No anxiety Endocrine: No palpitations, diaphoresis, change in appetite, change in weigh or increased thirst Hematologic/Lymphatic:  No anemia, purpura, petechiae. Allergic/Immunologic: No itchy/runny eyes, nasal congestion, recent allergic reactions, rashes  ALLERGIES: Allergies  Allergen Reactions   Latex Itching    HOME MEDICATIONS: No current outpatient medications on file.  PAST MEDICAL HISTO Past Medical History:  Diagnosis Date   BV (bacterial vaginosis)     PAST SURGICAL HISTORY: History reviewed. No pertinent surgical history.  FAMILY HISTORY: History reviewed. No pertinent family history.  SOCIAL HISTORY:  Social History   Socioeconomic History   Marital status: Single    Spouse name: Not on file   Number of children: 2   Years of education: 24   Highest education level: Not on file  Occupational History   Occupation: Geneticist, molecular  Tobacco Use   Smoking status: Never   Smokeless tobacco: Never  Vaping Use   Vaping Use: Never used  Substance and Sexual Activity   Alcohol use: No   Drug use: No   Sexual activity: Yes    Birth control/protection: None  Other Topics Concern   Not on file  Social History Narrative      Caffeine use: iced coffee daily   Right handed    Social Determinants of Health   Financial Resource Strain: Not on file  Food Insecurity: Not on file  Transportation Needs: Not on file  Physical Activity: Not on file  Stress: Not on file  Social Connections: Not on file  Intimate Partner Violence: Not on file     PHYSICAL EXAM  Vitals:   05/10/21 1100  BP: 116/79  Pulse: 94  Weight: 219 lb (99.3 kg)  Height: 5\' 4"  (1.626 m)    Body mass index is 37.59 kg/m.   General: The patient is well-developed and well-nourished and in no acute distress  HEENT:  Head is [place]/AT.  Sclera are anicteric.    Skin: Extremities are without rash or  edema.  \Neurologic Exam  Mental status: The  patient is alert and oriented x 3 at the time of the examination. The patient has apparent normal recent and remote memory, with an apparently normal attention span and concentration ability.   Speech is normal.  Cranial nerves: Extraocular movements are full.  She had normal symmetric elevation.  Facial strength and sensation was normal.  No obvious hearing deficits are noted.  Motor:  Muscle bulk is normal.   Tone is normal. Strength is  5 / 5 in all 4 extremities.   Sensory: Sensory testing is intact to pinprick, soft touch and vibration sensation in all 4 extremities.  Coordination: Cerebellar testing reveals good finger-nose-finger and heel-to-shin bilaterally.  Gait and station: Station is normal.  Gait is normal.  Tandem gait was normal.  Romberg was negative. . Reflexes: Deep tendon reflexes are symmetric and normal bilaterally.   Plantar responses are flexor.    DIAGNOSTIC DATA (  LABS, IMAGING, TESTING) - I reviewed patient records, labs, notes, testing and imaging myself where available.  Lab Results  Component Value Date   WBC 7.4 07/31/2015   HGB 12.0 12/15/2016   HCT 37.2 12/15/2016   MCV 83.8 07/31/2015   PLT 312 07/31/2015      Component Value Date/Time   NA 138 07/31/2015 1745   K 3.8 07/31/2015 1745   CL 104 07/31/2015 1745   CO2 25 07/31/2015 1745   GLUCOSE 81 07/31/2015 1745   BUN 13 07/31/2015 1745   CREATININE 0.68 07/31/2015 1745   CALCIUM 8.9 07/31/2015 1745   GFRNONAA >60 07/31/2015 1745   GFRAA >60 07/31/2015 1745       ASSESSMENT AND PLAN  White matter abnormality on MRI of brain - Plan: Sedimentation rate, C-reactive protein  Vitamin D deficiency - Plan: VITAMIN D 25 Hydroxy (Vit-D Deficiency, Fractures), Sedimentation rate, C-reactive protein  Right facial numbness - Plan: Sedimentation rate, C-reactive protein  1.  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  With no changes on MRI  over 2 years and lumbar puncture not showing abnormalities associated with MS, the likelihood that she has multiple sclerosis is small.   2.   Vitamin D was low.  We will recheck this and prescribe prescription strength supplementation if still less than 30. 3.   Return as needed if there are new or worsening neurologic symptoms.  We discussed typical symptoms of MS.  Lehi Phifer A. Epimenio Foot, MD, [facility] 05/10/2021, 12:07 PM Certified in Neurology, Clinical Neurophysiology, Sleep Medicine and Neuroimaging  [facility] [address] [place], [place] [address] [phone]

## 2021-05-10 NOTE — Telephone Encounter (Signed)
Pt called stating that she forgot the dosage that was recommended for her to take everyday of her Vitamin D. Please advise.

## 2021-05-11 LAB — C-REACTIVE PROTEIN: CRP: 1 mg/L (ref 0–10)

## 2021-05-11 LAB — VITAMIN D 25 HYDROXY (VIT D DEFICIENCY, FRACTURES): Vit D, 25-Hydroxy: 30.1 ng/mL (ref 30.0–100.0)

## 2021-05-11 LAB — SEDIMENTATION RATE: Sed Rate: 33 mm/hr — ABNORMAL HIGH (ref 0–32)

## 2021-10-12 ENCOUNTER — Encounter: Payer: Self-pay | Admitting: Neurology

## 2021-10-12 ENCOUNTER — Ambulatory Visit (INDEPENDENT_AMBULATORY_CARE_PROVIDER_SITE_OTHER): Payer: No Typology Code available for payment source | Admitting: Neurology

## 2021-10-12 VITALS — BP 108/78 | HR 80 | Ht 64.0 in | Wt 220.0 lb

## 2021-10-12 DIAGNOSIS — E559 Vitamin D deficiency, unspecified: Secondary | ICD-10-CM

## 2021-10-12 DIAGNOSIS — R930 Abnormal findings on diagnostic imaging of skull and head, not elsewhere classified: Secondary | ICD-10-CM

## 2021-10-12 DIAGNOSIS — R9082 White matter disease, unspecified: Secondary | ICD-10-CM | POA: Diagnosis not present

## 2021-10-12 DIAGNOSIS — R200 Anesthesia of skin: Secondary | ICD-10-CM | POA: Diagnosis not present

## 2021-10-12 NOTE — Progress Notes (Signed)
[facility]  PATIENT: Jennifer Paul DOB: 12-Feb-1996  REFERRING DOCTOR OR PCP:  Dr. Ferdinand CavaArus SOURCE: Patient, notes from [facility], imaging and lab reports, MRI images personally reviewed.  _________________________________   HISTORICAL  CHIEF COMPLAINT:  Chief Complaint  Patient presents with   Follow-up    Rm 2, pt with mom. Pt denies any new issues. Overall stable, no concerns    HISTORY OF PRESENT ILLNESS:  Jennifer Paul is a 26 y.o. woman with numbness abnormal MRI.  Update 10/12/2021: She is stable with no new symptoms.   The MRI from 03/2021 showed no new lesions or changes compared to the MRI's from 2021 and 2020.  All MRI's show periventricular foci including radially oriented foci.    She has not had any further facial numbness, the original symptom that occurred in 2020.  She also denies numbness in the arms or legs.  No other new neurologic symptoms.  Specifically, she notes no change in gait or balance.  The strength is fine.  She can go downstairs without holding the banister.   She denies bladder changes.   Vision is fine.   Color vision is symmetric.    No FH of MS or other autoimmune disorder.    Her MGM has seizures.      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     Headaches are doing well.    She had head trauma at age 89.  There was no LOC.   She as referred to Neurology and had a CT scan her mom believes.     Vit D was low and she takes 5000 U daily  No FH of MS   Abnormal MRI history:  In 2020, while pregnant, she was experiencing headaches.  She had an episode of right-sided facial and tongue numbness and sore Dr. Alton RevereFeraru.  This episode lasted 1 day.  An MRI was performed showing multiple small T2/FLAIR hyperintense foci in the periventricular white matter.     She delivered her son 09/09/2019.   She had pre-eclampsia post partum with elevated BP.    She had an LP 10/2019 and CSF did not show  elevated Kappa free light chains.   The MRI was repeated 04/09/2020 and showed no new lesions.  Imaging: MRI of the brain dated 06/15/2019 shows multiple small T2/FLAIR hyperintense foci, predominantly in the periventricular white matter.  The study was done without contrast.  2 of the foci were hyperintense on diffusion-weighted images.  The MRI is consistent with MS though not diagnostic by itself.  MRI of the brain 04/09/2020 shows T2/FLAIR hyperintense foci, many in the periventricular white matter, some radially oriented to the ventricles.  None of the foci appear to be acute and they do not enhance.  Compared to the MRI dated 06/15/2019, there are no definite new lesions.  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   MRI of the brain 04/13/2021 showed no new lesions compared to 04/09/2020 and 06/15/2019    Laboratory data.    Lumbar puncture was performed 10/31/2019.  Glucose, protein, cells were normal.  VDRL and Kappa free light chain was negative (oligoclonal bands was not performed).  ANA was negative.  2021, vitamin D was low (16.6 when she was placed on supplements  REVIEW OF SYSTEMS: Constitutional: No fevers, chills, sweats, or  change in appetite Eyes: No visual changes, double vision, eye pain Ear, nose and throat: No hearing loss, ear pain, nasal congestion, sore throat Cardiovascular: No chest pain, palpitations Respiratory:  No shortness of breath at rest or with exertion.   No wheezes GastrointestinaI: No nausea, vomiting, diarrhea, abdominal pain, fecal incontinence Genitourinary:  No dysuria, urinary retention or frequency.  No nocturia. Musculoskeletal:  No neck pain, back pain Integumentary: No rash, pruritus, skin lesions Neurological: as above Psychiatric: No depression at this time.  No anxiety Endocrine: No palpitations,  diaphoresis, change in appetite, change in weigh or increased thirst Hematologic/Lymphatic:  No anemia, purpura, petechiae. Allergic/Immunologic: No itchy/runny eyes, nasal congestion, recent allergic reactions, rashes  ALLERGIES: Allergies  Allergen Reactions   Latex Itching    HOME MEDICATIONS:  Current Outpatient Medications:    Cholecalciferol 125 MCG (5000 UT) TABS, Take by mouth., Disp: , Rfl:   PAST MEDICAL HISTO Past Medical History:  Diagnosis Date   BV (bacterial vaginosis)     PAST SURGICAL HISTORY: History reviewed. No pertinent surgical history.  FAMILY HISTORY: History reviewed. No pertinent family history.  SOCIAL HISTORY:  Social History   Socioeconomic History   Marital status: Single    Spouse name: Not on file   Number of children: 2   Years of education: 24   Highest education level: Not on file  Occupational History   Occupation: Geneticist, molecular  Tobacco Use   Smoking status: Never   Smokeless tobacco: Never  Vaping Use   Vaping Use: Never used  Substance and Sexual Activity   Alcohol use: No   Drug use: No   Sexual activity: Yes    Birth control/protection: None  Other Topics Concern   Not on file  Social History Narrative      Caffeine use: iced coffee daily   Right handed    Social Determinants of Health   Financial Resource Strain: Not on file  Food Insecurity: Not on file  Transportation Needs: Not on file  Physical Activity: Not on file  Stress: Not on file  Social Connections: Not on file  Intimate Partner Violence: Not on file     PHYSICAL EXAM  Vitals:   10/12/21 1048  BP: 108/78  Pulse: 80  Weight: 220 lb (99.8 kg)  Height: 5\' 4"  (1.626 m)    Body mass index is 37.76 kg/m.   General: The patient is well-developed and well-nourished and in no acute distress  HEENT:  Head is [place]/AT.  Sclera are anicteric.    Skin: Extremities are without rash or  edema.  \Neurologic Exam  Mental status:  The patient is alert and oriented x 3 at the time of the examination. The patient has apparent normal recent and remote memory, with an apparently normal attention span and concentration ability.   Speech is normal.  Cranial nerves: Extraocular movements are full.  She had normal symmetric elevation.  Facial strength and sensation was normal.  No obvious hearing deficits are noted.  Motor: Muscle bulk and tone are normal.. Strength is  5 / 5 in all 4 extremities.   Sensory: Sensory testing is intact to pinprick, soft touch and vibration sensation in all 4 extremities.  Coordination: Cerebellar testing reveals good finger-nose-finger and heel-to-shin bilaterally.  Gait and station: Station is normal.  Gait is normal. Tandem gait is normal   Romberg negative.  Reflexes: Deep tendon reflexes are symmetric and normal bilaterally.   Plantar responses are flexor.    DIAGNOSTIC  DATA (LABS, IMAGING, TESTING) - I reviewed patient records, labs, notes, testing and imaging myself where available.  Lab Results  Component Value Date   WBC 7.4 07/31/2015   HGB 12.0 12/15/2016   HCT 37.2 12/15/2016   MCV 83.8 07/31/2015   PLT 312 07/31/2015      Component Value Date/Time   NA 138 07/31/2015 1745   K 3.8 07/31/2015 1745   CL 104 07/31/2015 1745   CO2 25 07/31/2015 1745   GLUCOSE 81 07/31/2015 1745   BUN 13 07/31/2015 1745   CREATININE 0.68 07/31/2015 1745   CALCIUM 8.9 07/31/2015 1745   GFRNONAA >60 07/31/2015 1745   GFRAA >60 07/31/2015 1745       ASSESSMENT AND PLAN  Radiologically isolated syndrome  White matter abnormality on MRI of brain  Vitamin D deficiency  Right facial numbness  1.  She has had a single neurologic episode lasting less than 24 hours.  With such a short duration, it would not be that typical for demyelination.  However, the brain MRI shows changes very typical for MS.  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   Fortunately, the MRIs have been stable with no changes over 2 years and lumbar puncture not showing abnormalities associated with MS, the likelihood that she has multiple sclerosis is small.     Recommend repeat MRI of the brain and cervical spine in 2024.  If those images are also unchanged, then the likelihood she has MS becomes even smaller and we would not need to continue to monitor. 2.   Vitamin D was low.  And she is advised to continue to take supplements.  3.   Return in 1 year or as needed if there are new or worsening neurologic symptoms.  We discussed typical symptoms of MS.  Jennifer Paul A. Epimenio Foot, MD, [facility] 10/13/2021, 6:32 PM Certified in Neurology, Clinical Neurophysiology, Sleep Medicine and Neuroimaging  [facility] [address], [address] [place], [place] [address] [phone]

## 2021-10-13 ENCOUNTER — Ambulatory Visit: Payer: Medicaid Other | Admitting: Neurology

## 2022-04-04 ENCOUNTER — Encounter (HOSPITAL_BASED_OUTPATIENT_CLINIC_OR_DEPARTMENT_OTHER): Payer: Self-pay | Admitting: Emergency Medicine

## 2022-04-04 ENCOUNTER — Emergency Department (HOSPITAL_BASED_OUTPATIENT_CLINIC_OR_DEPARTMENT_OTHER)
Admission: EM | Admit: 2022-04-04 | Discharge: 2022-04-04 | Disposition: A | Discharge status: Home / Self Care | Payer: Medicaid Other | Attending: Emergency Medicine | Admitting: Emergency Medicine

## 2022-04-04 ENCOUNTER — Other Ambulatory Visit: Payer: Self-pay

## 2022-04-04 ENCOUNTER — Emergency Department (HOSPITAL_BASED_OUTPATIENT_CLINIC_OR_DEPARTMENT_OTHER): Payer: Medicaid Other

## 2022-04-04 DIAGNOSIS — R42 Dizziness and giddiness: Secondary | ICD-10-CM | POA: Diagnosis not present

## 2022-04-04 DIAGNOSIS — R519 Headache, unspecified: Secondary | ICD-10-CM | POA: Insufficient documentation

## 2022-04-04 DIAGNOSIS — R110 Nausea: Secondary | ICD-10-CM | POA: Insufficient documentation

## 2022-04-04 DIAGNOSIS — Z91040 Latex allergy status: Secondary | ICD-10-CM | POA: Insufficient documentation

## 2022-04-04 LAB — URINALYSIS, ROUTINE W REFLEX MICROSCOPIC
Bilirubin Urine: NEGATIVE
Glucose, UA: NEGATIVE mg/dL
Ketones, ur: NEGATIVE mg/dL
Nitrite: NEGATIVE
Protein, ur: NEGATIVE mg/dL
Specific Gravity, Urine: 1.02 (ref 1.005–1.030)
pH: 5.5 (ref 5.0–8.0)

## 2022-04-04 LAB — URINALYSIS, MICROSCOPIC (REFLEX): RBC / HPF: >50 RBC/hpf (ref 0–5)

## 2022-04-04 LAB — CBC
HCT: 39.4 % (ref 36.0–46.0)
Hemoglobin: 12.4 g/dL (ref 12.0–15.0)
MCH: 27.3 pg (ref 26.0–34.0)
MCHC: 31.5 g/dL (ref 30.0–36.0)
MCV: 86.8 fL (ref 80.0–100.0)
Platelets: 386 10*3/uL (ref 150–400)
RBC: 4.54 MIL/uL (ref 3.87–5.11)
RDW: 12.8 % (ref 11.5–15.5)
WBC: 6.6 10*3/uL (ref 4.0–10.5)
nRBC: 0 % (ref 0.0–0.2)

## 2022-04-04 LAB — BASIC METABOLIC PANEL
Anion gap: 7 (ref 5–15)
BUN: 9 mg/dL (ref 6–20)
CO2: 26 mmol/L (ref 22–32)
Calcium: 9.3 mg/dL (ref 8.9–10.3)
Chloride: 105 mmol/L (ref 98–111)
Creatinine, Ser: 0.67 mg/dL (ref 0.44–1.00)
GFR, Estimated: >60 mL/min (ref >60)
Glucose, Bld: 110 mg/dL — ABNORMAL HIGH (ref 70–99)
Potassium: 3.5 mmol/L (ref 3.5–5.1)
Sodium: 138 mmol/L (ref 135–145)

## 2022-04-04 LAB — PREGNANCY, URINE: Preg Test, Ur: NEGATIVE

## 2022-04-04 MED ORDER — ONDANSETRON 4 MG PO TBDP
4.0000 mg | ORAL_TABLET | Freq: Once | ORAL | Status: AC
  Administered 2022-04-04: 4 mg via ORAL
  Filled 2022-04-04: qty 1

## 2022-04-04 MED ORDER — MECLIZINE HCL 25 MG PO TABS
25.0000 mg | ORAL_TABLET | Freq: Once | ORAL | Status: AC
  Administered 2022-04-04: 25 mg via ORAL
  Filled 2022-04-04: qty 1

## 2022-04-04 MED ORDER — MECLIZINE HCL 25 MG PO TABS
25.0000 mg | ORAL_TABLET | Freq: Three times a day (TID) | ORAL | 0 refills | Status: AC | PRN
Start: 2022-04-04 — End: ?

## 2022-04-04 NOTE — ED Triage Notes (Signed)
Pt c/o right sided headache and dizziness x 3 days. Hx of vertigo. Denies n/v/d. States she feels dehydrated.

## 2022-04-04 NOTE — ED Provider Notes (Signed)
[facility] EMERGENCY DEPARTMENT Provider Note   CSN: [id number] Arrival date & time: 04/04/22  1437     History  Chief Complaint  Patient presents with   Dizziness   Headache    Jennifer Paul is a 26 y.o. female.  Patient presents chief complaint of lightheadedness and dizziness.  She states she had similar symptoms before in the past and was diagnosed with vertigo.  She had been doing fine until just recently symptoms started again within the last 3 days.  She denies any fall or trauma.  Denies any headache but just states that her head does not feel right.  Complaining of some nausea and unsteady gait.  No reports of fevers or cough or vomiting or diarrhea no neck pain no explicit head pain or abdominal pain or chest pain.       Home Medications Prior to Admission medications   Medication Sig Start Date End Date Taking? Authorizing Provider  meclizine (ANTIVERT) 25 MG tablet Take 1 tablet (25 mg total) by mouth 3 (three) times daily as needed for dizziness. 04/04/22  Yes Cheryll Cockayne, MD  Cholecalciferol 125 MCG (5000 UT) TABS Take by mouth.    [provider]      Allergies    Latex    Review of Systems   Review of Systems  Constitutional:  Negative for fever.  HENT:  Negative for ear pain.   Eyes:  Negative for pain.  Respiratory:  Negative for cough.   Cardiovascular:  Negative for chest pain.  Gastrointestinal:  Negative for abdominal pain.  Genitourinary:  Negative for flank pain.  Musculoskeletal:  Negative for back pain.  Skin:  Negative for rash.  Neurological:  Positive for dizziness and light-headedness.    Physical Exam Updated Vital Signs BP 109/74   Pulse 86   Temp 98.2 F (36.8 C) (Oral)   Resp 18   Wt 101.6 kg   LMP 04/02/2022 (Exact Date)   SpO2 100%   BMI 38.45 kg/m  Physical Exam Constitutional:      General: She is not in acute distress.    Appearance: Normal appearance.  HENT:     Head: Normocephalic.      Nose: Nose normal.  Eyes:     Extraocular Movements: Extraocular movements intact.     Pupils: Pupils are equal, round, and reactive to light.  Cardiovascular:     Rate and Rhythm: Normal rate.  Pulmonary:     Effort: Pulmonary effort is normal.  Musculoskeletal:        General: Normal range of motion.     Cervical back: Normal range of motion.  Neurological:     General: No focal deficit present.     Mental Status: She is alert and oriented to person, place, and time. Mental status is at baseline.     Cranial Nerves: No cranial nerve deficit.     Motor: No weakness.     Gait: Gait normal.     Comments: Intact finger-nose and heel-to-shin.  Gait is normal without any assistance.     ED Results / Procedures / Treatments   Labs (all labs ordered are listed, but only abnormal results are displayed) Labs Reviewed  BASIC METABOLIC PANEL - Abnormal; Notable for the following components:      Result Value   Glucose, Bld 110 (*)    All other components within normal limits  URINALYSIS, ROUTINE W REFLEX MICROSCOPIC - Abnormal; Notable for the following components:   APPearance  HAZY (*)    Hgb urine dipstick LARGE (*)    Leukocytes,Ua TRACE (*)    All other components within normal limits  URINALYSIS, MICROSCOPIC (REFLEX) - Abnormal; Notable for the following components:   Bacteria, UA FEW (*)    All other components within normal limits  CBC  PREGNANCY, URINE    EKG EKG Interpretation  Date/Time:  Tuesday April 04 2022 15:14:30 EDT Ventricular Rate:  93 PR Interval:  180 QRS Duration: 96 QT Interval:  368 QTC Calculation: 457 R Axis:   63 Text Interpretation: Normal sinus rhythm Nonspecific T wave abnormality Abnormal ECG No previous ECGs available Confirmed by Norman Clay ([id number]) on 04/04/2022 5:52:01 PM  Radiology CT Head Wo Contrast  Result Date: 04/04/2022 CLINICAL DATA:  Dizziness, non-specific unsteady gait EXAM: CT HEAD WITHOUT CONTRAST TECHNIQUE: Contiguous  axial images were obtained from the base of the skull through the vertex without intravenous contrast. RADIATION DOSE REDUCTION: This exam was performed according to the departmental dose-optimization program which includes automated exposure control, adjustment of the mA and/or kV according to patient size and/or use of iterative reconstruction technique. COMPARISON:  MRI (without report) April 13, 2021. FINDINGS: Brain: No evidence of acute large vascular territory infarction, hemorrhage, hydrocephalus, extra-axial collection or mass lesion/mass effect. Vascular: No hyperdense vessel identified. Skull: No acute fracture. Sinuses/Orbits: Frothy secretions in the left sphenoid sinus with scattered mild paranasal sinus mucosal thickening. No acute orbital findings. Other: No mastoid effusions. IMPRESSION: No evidence of acute intracranial abnormality. Electronically Signed   By: Feliberto Harts M.D.   On: 04/04/2022 18:59    Procedures Procedures    Medications Ordered in ED Medications  ondansetron (ZOFRAN-ODT) disintegrating tablet 4 mg (4 mg Oral Given 04/04/22 1804)  meclizine (ANTIVERT) tablet 25 mg (25 mg Oral Given 04/04/22 1803)    ED Course/ Medical Decision Making/ A&P                           Medical Decision Making Amount and/or Complexity of Data Reviewed Labs: ordered. Radiology: ordered.  Risk Prescription drug management.   Review of record shows office visit January 11, 2022 for tension type headache.  Cardiac monitoring showing sinus rhythm.  Diagnostic studies were sent CT imaging of the brain shows no acute findings, labs otherwise unremarkable.  Patient given meclizine and Zofran with resolution of symptoms.  Currently without any neurodeficit, ambulatory without assistance, doing much better.  Differential diagnosis included CVA versus vertigo versus sodium abnormality versus other.  I doubt acute stroke given prior history and clinical findings today.  We will  recommend close outpatient follow-up with her doctor this week, recommending immediate return for worsening symptoms new fevers vomiting or any additional concerns.        Final Clinical Impression(s) / ED Diagnoses Final diagnoses:  Dizziness    Rx / DC Orders ED Discharge Orders          Ordered    meclizine (ANTIVERT) 25 MG tablet  3 times daily PRN        04/04/22 1919              Cheryll Cockayne, MD 04/04/22 1919

## 2022-04-04 NOTE — ED Notes (Signed)
Rx x 1 given  Written and verbal inst to pt  Verbalized an understanding  To home with father

## 2022-04-04 NOTE — Discharge Instructions (Addendum)
Call your primary care doctor or specialist as discussed in the next 2-3 days.   Return immediately back to the ER if:  Your symptoms worsen within the next 12-24 hours. You develop new symptoms such as new fevers, persistent vomiting, new pain, shortness of breath, or new weakness or numbness, or if you have any other concerns.  

## 2022-04-04 NOTE — ED Notes (Signed)
Patient transported to CT 

## 2022-04-04 NOTE — ED Provider Triage Note (Signed)
Emergency Medicine Provider Triage Evaluation Note  Jennifer Paul , a 26 y.o. female  was evaluated in triage.  Pt complains of feeling dizzy.  This has been ongoing for three days.  No history of similar.  Does have a history of headaches but isn't having much of a headache right now.  She denies any fevers.  She reports poor PO intake due to being busy at work.   Her dizziness is constant.     Physical Exam  BP 113/67 (BP Location: Left Arm)   Pulse 91   Temp 98.2 F (36.8 C) (Oral)   Resp 20   Wt 101.6 kg   LMP 04/02/2022 (Exact Date)   SpO2 100%   BMI 38.45 kg/m  Gen:   Awake, no distress   Resp:  Normal effort  MSK:   Moves extremities without difficulty  Other:  Normal speech.   Medical Decision Making  Medically screening exam initiated at 3:16 PM.  Appropriate orders placed.  Creta Dorame was informed that the remainder of the evaluation will be completed by another provider, this initial triage assessment does not replace that evaluation, and the importance of remaining in the ED until their evaluation is complete.     Cristina Gong, [place] 04/04/22 1520

## 2022-10-12 ENCOUNTER — Encounter: Payer: Self-pay | Admitting: Neurology

## 2022-10-12 ENCOUNTER — Ambulatory Visit: Payer: No Typology Code available for payment source | Admitting: Neurology

## 2023-01-16 ENCOUNTER — Ambulatory Visit (INDEPENDENT_AMBULATORY_CARE_PROVIDER_SITE_OTHER): Payer: Medicaid Other | Admitting: Neurology

## 2023-01-16 ENCOUNTER — Encounter: Payer: Self-pay | Admitting: Neurology

## 2023-01-16 VITALS — BP 119/75 | HR 91 | Ht 64.0 in | Wt 244.5 lb

## 2023-01-16 DIAGNOSIS — R930 Abnormal findings on diagnostic imaging of skull and head, not elsewhere classified: Secondary | ICD-10-CM | POA: Diagnosis not present

## 2023-01-16 DIAGNOSIS — E559 Vitamin D deficiency, unspecified: Secondary | ICD-10-CM | POA: Diagnosis not present

## 2023-01-16 DIAGNOSIS — R9082 White matter disease, unspecified: Secondary | ICD-10-CM

## 2023-01-16 DIAGNOSIS — R200 Anesthesia of skin: Secondary | ICD-10-CM | POA: Diagnosis not present

## 2023-01-16 NOTE — Progress Notes (Signed)
[facility]  PATIENT: Jennifer Paul DOB: Jul 29, 1996  REFERRING DOCTOR OR PCP:  Dr. Ferdinand Cava SOURCE: Patient, notes from [facility], imaging and lab reports, MRI images personally reviewed.  _________________________________   HISTORICAL  CHIEF COMPLAINT:  Chief Complaint  Patient presents with   Room 10    Pt is here with her Mother and Daughter. Pt states that things have been going good. No new symptoms to report.     HISTORY OF PRESENT ILLNESS:  Jennifer Paul is a 27 y.o. woman with numbness abnormal MRI.  Update 01/16/2023: She is stable with no new symptoms.   The MRI from 03/2021 showed no new lesions or changes compared to the MRI's from 2021 and 2020.  All MRI's show a stable pattern off periventricular foci including radially oriented foci.    She has not had any further facial numbness, the original symptom that occurred in 2020.  She also denies numbness in the arms or legs.  No other new neurologic symptoms.  Specifically, she notes no change in gait or balance.  The strength is fine.  She can go downstairs without holding the banister.   She denies bladder changes.   Vision is fine.   Color vision is symmetric.    No FH of MS or other autoimmune disorder.    Her MGM has seizures.      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     Headaches are doing well.  She has photophobia and phonophobia but no N/V.  These usually occur premenstrualy just 1-2 days and Tylenol relieves the pain.      Vit D was low and she takes 5000 U daily  No FH of MS   Abnormal MRI history:  In 2020, while pregnant, she was experiencing headaches.  She had an episode of right-sided facial and tongue numbness and saw Dr. Alton Revere.  This episode lasted 1 day.  An MRI was performed showing multiple small T2/FLAIR hyperintense foci in the periventricular white matter.     She delivered her son 09/09/2019.   She had pre-eclampsia post  partum with elevated BP.    She had an LP 10/2019 and CSF did not show elevated Kappa free light chains.   The MRI was repeated 04/09/2020 and showed no new lesions.  Imaging: MRI of the brain dated 06/15/2019 shows multiple small T2/FLAIR hyperintense foci, predominantly in the periventricular white matter.  The study was done without contrast.  2 of the foci were hyperintense on diffusion-weighted images.  The MRI is consistent with MS though not diagnostic by itself.  MRI of the brain 04/09/2020 shows T2/FLAIR hyperintense foci, many in the periventricular white matter, some radially oriented to the ventricles.  None of the foci appear to be acute and they do not enhance.  Compared to the MRI dated 06/15/2019, there are no definite new lesions.  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   MRI of the brain 04/13/2021 showed no new lesions compared to 04/09/2020 and 06/15/2019    Laboratory data.    Lumbar puncture was performed 10/31/2019.  Glucose, protein, cells were normal.  VDRL and Kappa free light chain was negative (oligoclonal bands was not performed).  ANA was negative.  2021, vitamin D was low (16.6 when she was placed on supplements  REVIEW OF SYSTEMS:  Constitutional: No fevers, chills, sweats, or change in appetite Eyes: No visual changes, double vision, eye pain Ear, nose and throat: No hearing loss, ear pain, nasal congestion, sore throat Cardiovascular: No chest pain, palpitations Respiratory:  No shortness of breath at rest or with exertion.   No wheezes GastrointestinaI: No nausea, vomiting, diarrhea, abdominal pain, fecal incontinence Genitourinary:  No dysuria, urinary retention or frequency.  No nocturia. Musculoskeletal:  No neck pain, back pain Integumentary: No rash, pruritus, skin lesions Neurological: as above Psychiatric: No  depression at this time.  No anxiety Endocrine: No palpitations, diaphoresis, change in appetite, change in weigh or increased thirst Hematologic/Lymphatic:  No anemia, purpura, petechiae. Allergic/Immunologic: No itchy/runny eyes, nasal congestion, recent allergic reactions, rashes  ALLERGIES: Allergies  Allergen Reactions   Latex Itching    HOME MEDICATIONS:  Current Outpatient Medications:    Cholecalciferol 125 MCG (5000 UT) TABS, Take by mouth., Disp: , Rfl:    Probiotic Product (PRO-BIOTIC BLEND PO), Take by mouth., Disp: , Rfl:    meclizine (ANTIVERT) 25 MG tablet, Take 1 tablet (25 mg total) by mouth 3 (three) times daily as needed for dizziness. (Patient not taking: Reported on 01/16/2023), Disp: 30 tablet, Rfl: 0  PAST MEDICAL HISTO Past Medical History:  Diagnosis Date   BV (bacterial vaginosis)     PAST SURGICAL HISTORY: History reviewed. No pertinent surgical history.  FAMILY HISTORY: History reviewed. No pertinent family history.  SOCIAL HISTORY:  Social History   Socioeconomic History   Marital status: Single    Spouse name: Not on file   Number of children: 2   Years of education: 24   Highest education level: Not on file  Occupational History   Occupation: Geneticist, molecular  Tobacco Use   Smoking status: Never   Smokeless tobacco: Never  Vaping Use   Vaping Use: Never used  Substance and Sexual Activity   Alcohol use: No   Drug use: No   Sexual activity: Yes    Birth control/protection: None  Other Topics Concern   Not on file  Social History Narrative   Caffeine use: iced coffee daily   Right handed    Social Determinants of Health   Financial Resource Strain: Not on file  Food Insecurity: Not on file  Transportation Needs: Not on file  Physical Activity: Not on file  Stress: Not on file  Social Connections: Not on file  Intimate Partner Violence: Not on file     PHYSICAL EXAM  Vitals:   01/16/23 1559  BP: 119/75   Pulse: 91  Weight: 244 lb 8 oz (110.9 kg)  Height:  (1.626 m)    Body mass index is 41.97 kg/m.   General: The patient is well-developed and well-nourished and in no acute distress  HEENT:  Head is [place]/AT.  Sclera are anicteric.    Skin: Extremities are without rash or  edema.  \Neurologic Exam  Mental status: The patient is alert and oriented x 3 at the time of the examination. The patient has apparent normal recent and remote memory, with an apparently normal attention span and concentration ability.   Speech is normal.  Cranial nerves: Extraocular movements are full.  She had normal symmetric elevation.  Facial strength and sensation was normal.  No obvious hearing deficits are noted.  Motor: Muscle bulk and tone are normal.. Strength is  5 / 5 in all 4 extremities.   Sensory: Sensory testing is intact to pinprick, soft touch and vibration sensation  in all 4 extremities.  Coordination: Cerebellar testing reveals good finger-nose-finger and heel-to-shin bilaterally.  Gait and station: Station is normal.  Gait is normal. Tandem gait is normal   Romberg negative.  Reflexes: Deep tendon reflexes are symmetric and normal bilaterally.   Plantar responses are flexor.    DIAGNOSTIC DATA (LABS, IMAGING, TESTING) - I reviewed patient records, labs, notes, testing and imaging myself where available.  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  She has had a single neurologic episode lasting less than 24 hours in 2020.  However, MRI shows changes typical for MS.  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 F/u MRI x 2 was unchanged.    We will check one more MRI to see if any progression.  If present, then consider a DMT.  If negative and knowing that there has been stability over 4 years with a negative lumbar puncture, then there is a much lower likelihood of MS and additional testing would not be necessary 2.   Vitamin D was low when I first saw her.  And she is advised to continue to take supplements.  3.   Return prn if new or worsening neurologic symptoms.  We discussed typical symptoms of MS.  Quy Lotts A. Epimenio Foot, MD, [facility] 01/16/2023, 4:19 PM Certified in Neurology, Clinical Neurophysiology, Sleep Medicine and Neuroimaging  [facility] [address] [place], [place] [address] [phone]

## 2023-01-17 ENCOUNTER — Telehealth: Payer: Self-pay | Admitting: Neurology

## 2023-01-17 NOTE — Telephone Encounter (Signed)
[employer] medicaid Jennifer Paul: [id number] exp. 01/17/23-03/03/23 sent to GI [phone]

## 2023-02-03 ENCOUNTER — Ambulatory Visit
Admission: RE | Admit: 2023-02-03 | Discharge: 2023-02-03 | Disposition: A | Discharge status: Home / Self Care | Payer: Medicaid Other | Source: Ambulatory Visit | Attending: Neurology | Admitting: Neurology

## 2023-02-03 DIAGNOSIS — R9082 White matter disease, unspecified: Secondary | ICD-10-CM

## 2023-02-03 DIAGNOSIS — R930 Abnormal findings on diagnostic imaging of skull and head, not elsewhere classified: Secondary | ICD-10-CM

## 2023-02-03 DIAGNOSIS — R200 Anesthesia of skin: Secondary | ICD-10-CM

## 2023-02-03 MED ORDER — GADOPICLENOL 0.5 MMOL/ML IV SOLN
10.0000 mL | Freq: Once | INTRAVENOUS | Status: AC | PRN
Start: 2023-02-03 — End: 2023-02-03
  Administered 2023-02-03: 10 mL via INTRAVENOUS

## 2023-02-03 MED ORDER — GADOPICLENOL 0.5 MMOL/ML IV SOLN: 10.0000 mL | Freq: Once | INTRAVENOUS | Status: DC | PRN

## 2023-02-13 ENCOUNTER — Telehealth: Payer: Self-pay | Admitting: Neurology

## 2023-02-13 NOTE — Telephone Encounter (Signed)
Pt called stated she had a dizzy spell yesterday and she wants to talk to Dr. Epimenio Foot about getting so medication. Stated she was like swing headed all last night, pt said she still feels funny.

## 2023-02-13 NOTE — Telephone Encounter (Addendum)
Called pt and she stated that yesterday she had gotten off work and was at home on the couch and when she had gotten up to open the door she started feeling funny. Pt states she was feeling bad a couple of days ago prior to her episode yesterday and got tested for Covid and the Flu and they both came back negative. Pt states that the last time she had felt like this was when she was pregnant with her son 4 years ago. Pt is concerned that her symptoms can either be MS or she can be pregnant. Pt states that se is going to take a pregnancy test to see if she is pregnant if not she is wanting to know what to do. Pt also wants something for the dizziness and nauseous feeling she is having.

## 2023-02-14 NOTE — Telephone Encounter (Signed)
Please see MyChart Message from today 02/14/2023

## 2023-09-27 ENCOUNTER — Encounter (HOSPITAL_BASED_OUTPATIENT_CLINIC_OR_DEPARTMENT_OTHER): Payer: Self-pay | Admitting: Emergency Medicine

## 2023-09-27 ENCOUNTER — Emergency Department (HOSPITAL_BASED_OUTPATIENT_CLINIC_OR_DEPARTMENT_OTHER)
Admission: EM | Admit: 2023-09-27 | Discharge: 2023-09-27 | Disposition: A | Discharge status: Home / Self Care | Payer: Medicaid Other | Attending: Emergency Medicine | Admitting: Emergency Medicine

## 2023-09-27 ENCOUNTER — Other Ambulatory Visit: Payer: Self-pay

## 2023-09-27 DIAGNOSIS — Z91040 Latex allergy status: Secondary | ICD-10-CM | POA: Insufficient documentation

## 2023-09-27 DIAGNOSIS — L259 Unspecified contact dermatitis, unspecified cause: Secondary | ICD-10-CM | POA: Insufficient documentation

## 2023-09-27 DIAGNOSIS — I10 Essential (primary) hypertension: Secondary | ICD-10-CM | POA: Insufficient documentation

## 2023-09-27 DIAGNOSIS — R21 Rash and other nonspecific skin eruption: Secondary | ICD-10-CM | POA: Diagnosis present

## 2023-09-27 MED ORDER — TRIAMCINOLONE ACETONIDE 0.1 % EX CREA: 1.0000 | TOPICAL_CREAM | Freq: Two times a day (BID) | CUTANEOUS | 0 refills | Status: AC

## 2023-09-27 MED ORDER — FAMOTIDINE 20 MG PO TABS: 20.0000 mg | ORAL_TABLET | Freq: Two times a day (BID) | ORAL | 0 refills | Status: AC

## 2023-09-27 NOTE — Discharge Instructions (Signed)
 As we discussed, your rash is likely due to some sort of skin irritant that you are exposed to.  I have given you a prescription for triamcinolone  cream for you to place on the areas that are affected.  Do not get this in your eyes, mouth or genitals.  I have also given you a prescription for Pepcid  which she can take for itching.  You may also get Benadryl  over-the-counter as needed.  Follow-up with your primary care doctor.  Return if development of any new or worsening symptoms.

## 2023-09-27 NOTE — ED Triage Notes (Signed)
 Pt reports rash on bilateral hands, wrists and arm. Pt reports she also has a rash on her back and now her foot. Denies SHOB. Reports it is red and itchy

## 2023-09-27 NOTE — ED Provider Notes (Signed)
 [place] [facility] Provider Note   CSN: [id number] Arrival date & time: 09/27/23  2025     History  Chief Complaint  Patient presents with   Rash    Jennifer Paul is a 28 y.o. female.  Patient with history of hypertension presents today with complaints of rash.  She states that same began a few days ago on the back of her right hand. States she has also noted on the back of her foot and her left upper back and shoulder area.  States that the area on her foot is mostly resolved, however the area on her hand and back have persisted.  Denies any history of similar symptoms previously.  Does states she started a new job a few weeks ago and has exposure to a new dog.  Denies any other new exposures.  No fevers, joint pain, fatigue, or tick bites.  The rash is itchy.  She has not tried anything for the symptoms.  The history is provided by the patient. No language interpreter was used.  Rash      Home Medications Prior to Admission medications   Medication Sig Start Date End Date Taking? Authorizing Provider  Cholecalciferol 125 MCG (5000 UT) TABS Take by mouth.    [provider]  meclizine  (ANTIVERT ) 25 MG tablet Take 1 tablet (25 mg total) by mouth 3 (three) times daily as needed for dizziness. Patient not taking: Reported on 01/16/2023 04/04/22   Hong, Joshua S, MD  Probiotic Product (PRO-BIOTIC BLEND PO) Take by mouth.    [provider]      Allergies    Latex    Review of Systems   Review of Systems  Skin:  Positive for rash.  All other systems reviewed and are negative.   Physical Exam Updated Vital Signs BP 128/80 (BP Location: Left Arm)   Pulse 92   Temp 98.3 F (36.8 C)   Resp 20   SpO2 100%  Physical Exam Vitals and nursing note reviewed.  Constitutional:      General: She is not in acute distress.    Appearance: Normal appearance. She is normal weight. She is not ill-appearing, toxic-appearing or  diaphoretic.  HENT:     Head: Normocephalic and atraumatic.  Cardiovascular:     Rate and Rhythm: Normal rate.  Pulmonary:     Effort: Pulmonary effort is normal. No respiratory distress.  Abdominal:     General: Abdomen is flat.     Palpations: Abdomen is soft.     Tenderness: There is no abdominal tenderness.  Musculoskeletal:        General: Normal range of motion.     Cervical back: Normal range of motion.  Skin:    General: Skin is warm and dry.     Comments: Punctate non-erythematous raised lesions noted to the base of the right thumb and the back of the left shoulder. No purulence or drainage. No pustules or vesicles.  No mucosal involvement.  Spares the palms and soles.  No erythema, warmth, fluctuance, or induration.  Negative Nikolsky sign  Neurological:     General: No focal deficit present.     Mental Status: She is alert and oriented to person, place, and time.  Psychiatric:        Mood and Affect: Mood normal.        Behavior: Behavior normal.     ED Results / Procedures / Treatments   Labs (all labs ordered are  listed, but only abnormal results are displayed) Labs Reviewed - No data to display  EKG None  Radiology No results found.  Procedures Procedures    Medications Ordered in ED Medications - No data to display  ED Course/ Medical Decision Making/ A&P                                 Medical Decision Making  Patient presents today with a few days of an itchy rash.  She is afebrile, nontoxic-appearing, in no acute distress with reassuring vital signs.  Physical exam reveals punctate nonerythematous raised lesions present to the base of the right thumb and the left shoulder.  Rash consistent with contact dermatitis. Patient denies any difficulty breathing or swallowing.  Pt has a patent airway without stridor and is handling secretions without difficulty; no angioedema. No blisters, no pustules, no warmth, no draining sinus tracts, no superficial  abscesses, no bullous impetigo, no vesicles, no desquamation, no target lesions with dusky purpura or a central bulla. Not tender to touch. No concern for superimposed infection. No concern for SJS, TEN, TSS, tick borne illness, syphilis or other life-threatening condition. Will discharge home with triamcinolone  cream, pepcid  and recommend Benadryl  as needed for pruritis. Evaluation and diagnostic testing in the emergency department does not suggest an emergent condition requiring admission or immediate intervention beyond what has been performed at this time.  Plan for discharge with close PCP follow-up.  Patient is understanding and amenable with plan, educated on red flag symptoms that would prompt immediate return.  Patient discharged in stable condition.  Final Clinical Impression(s) / ED Diagnoses Final diagnoses:  Contact dermatitis, unspecified contact dermatitis type, unspecified trigger    Rx / DC Orders ED Discharge Orders          Ordered    triamcinolone  cream (KENALOG ) 0.1 %  2 times daily        09/27/23 2248    famotidine  (PEPCID ) 20 MG tablet  2 times daily        09/27/23 2248          An After Visit Summary was printed and given to the patient.     Nora Lauraine LABOR, PA-C 09/27/23 2249    Pamella Ozell LABOR, DO 10/03/23 1355
